# Patient Record
Sex: Female | Born: 1975
Health system: Southern US, Community
[De-identification: ages and names within clinical notes are randomized; demographics above are authoritative.]

## PROBLEM LIST (undated history)

## (undated) ENCOUNTER — Inpatient Hospital Stay (HOSPITAL_COMMUNITY): Payer: Self-pay

## (undated) DIAGNOSIS — Z87442 Personal history of urinary calculi: Secondary | ICD-10-CM

## (undated) DIAGNOSIS — F419 Anxiety disorder, unspecified: Secondary | ICD-10-CM

## (undated) HISTORY — PX: WISDOM TOOTH EXTRACTION: SHX21

## (undated) HISTORY — PX: DILATION AND CURETTAGE OF UTERUS: SHX78

## (undated) HISTORY — DX: Anxiety disorder, unspecified: F41.9

---

## 2012-10-06 NOTE — L&D Delivery Note (Signed)
SVD of VFI at 1104.  APGARS 9,9.  EBL 300cc.  Placenta to L&D. Head delivered ROA with loose nuchal x 1 reduced.  Body delivered atraumatically.  Mouth and nose bulb suctioned.  Cord clamped, cut and baby to abdomen.  Cord pH obtained.  Placenta delivered S/I/3VC.  Fundus firmed with pitocin and massage.  2nd degree perineal lac repaired with 3-0 Vicryl rapide in the normal fashion.  Mom and baby stable.

## 2013-03-28 LAB — OB RESULTS CONSOLE ABO/RH: RH Type: POSITIVE

## 2013-03-28 LAB — OB RESULTS CONSOLE HEPATITIS B SURFACE ANTIGEN: Hepatitis B Surface Ag: NEGATIVE

## 2013-03-28 LAB — OB RESULTS CONSOLE RUBELLA ANTIBODY, IGM: Rubella: IMMUNE

## 2013-03-28 LAB — OB RESULTS CONSOLE RPR: RPR: NONREACTIVE

## 2013-03-28 LAB — OB RESULTS CONSOLE HIV ANTIBODY (ROUTINE TESTING): HIV: NONREACTIVE

## 2013-09-09 ENCOUNTER — Encounter (HOSPITAL_COMMUNITY): Payer: Self-pay | Admitting: *Deleted

## 2013-09-09 ENCOUNTER — Inpatient Hospital Stay (HOSPITAL_COMMUNITY)
Admission: AD | Admit: 2013-09-09 | Discharge: 2013-09-09 | Disposition: A | Discharge status: Home / Self Care | Payer: BC Managed Care – PPO | Source: Ambulatory Visit | Attending: Obstetrics and Gynecology | Admitting: Obstetrics and Gynecology

## 2013-09-09 DIAGNOSIS — O26899 Other specified pregnancy related conditions, unspecified trimester: Secondary | ICD-10-CM

## 2013-09-09 DIAGNOSIS — O9989 Other specified diseases and conditions complicating pregnancy, childbirth and the puerperium: Secondary | ICD-10-CM

## 2013-09-09 DIAGNOSIS — N898 Other specified noninflammatory disorders of vagina: Secondary | ICD-10-CM | POA: Insufficient documentation

## 2013-09-09 DIAGNOSIS — N390 Urinary tract infection, site not specified: Secondary | ICD-10-CM | POA: Insufficient documentation

## 2013-09-09 DIAGNOSIS — R109 Unspecified abdominal pain: Secondary | ICD-10-CM

## 2013-09-09 DIAGNOSIS — O2390 Unspecified genitourinary tract infection in pregnancy, unspecified trimester: Secondary | ICD-10-CM | POA: Insufficient documentation

## 2013-09-09 LAB — URINE MICROSCOPIC-ADD ON

## 2013-09-09 LAB — URINALYSIS, ROUTINE W REFLEX MICROSCOPIC
Bilirubin Urine: NEGATIVE
Glucose, UA: NEGATIVE mg/dL
Protein, ur: NEGATIVE mg/dL
Urobilinogen, UA: 0.2 mg/dL (ref 0.0–1.0)

## 2013-09-09 MED ORDER — HYDROCODONE-ACETAMINOPHEN 5-325 MG PO TABS
1.0000 | ORAL_TABLET | Freq: Once | ORAL | Status: AC
  Administered 2013-09-09: 1 via ORAL
  Filled 2013-09-09: qty 1

## 2013-09-09 MED ORDER — NITROFURANTOIN MONOHYD MACRO 100 MG PO CAPS: 100.0000 mg | ORAL_CAPSULE | Freq: Two times a day (BID) | ORAL | Status: DC

## 2013-09-09 NOTE — MAU Provider Note (Signed)
History     CSN: [id number]  Arrival date and time: 09/09/13 1112   First Provider Initiated Contact with Patient 09/09/13 1158      Chief Complaint  Patient presents with  . Abdominal Pain   HPI Ms. Myria Steenbergen is a 37 y.o. G3P1011 at [redacted]w[redacted]d who presents to MAU today with complaint of right mid abdominal pain. The patient was seen by Dr. Rana Snare yesterday and told to follow-up here if pain continued. The patient states that the pain is sharp and feels like tightening. She denies N/V/D or constipation, fever, vaginal bleeding or LOF. She has had some vaginal discharge recently that is unchanged. She states that she noted occasional contractions earlier today, but much less now.   OB History   Grav Para Term Preterm Abortions TAB SAB Ect Mult Living   3 1 1  1  1   1       History reviewed. No pertinent past medical history.  History reviewed. No pertinent past surgical history.  History reviewed. No pertinent family history.  History  Substance Use Topics  . Smoking status: Never Smoker   . Smokeless tobacco: Never Used  . Alcohol Use: No    Allergies:  Allergies  Allergen Reactions  . Shellfish Allergy Anaphylaxis    seizures    Prescriptions prior to admission  Medication Sig Dispense Refill  . azithromycin (ZITHROMAX) 250 MG tablet Take 250 mg by mouth daily.      . butalbital-acetaminophen-caffeine (FIORICET, ESGIC) 50-325-40 MG per tablet Take 1 tablet by mouth 2 (two) times daily as needed for headache.      . Prenatal Vit-Fe Fumarate-FA (PRENATAL MULTIVITAMIN) TABS tablet Take 1 tablet by mouth daily at 12 noon.        Review of Systems  Constitutional: Negative for fever and malaise/fatigue.  Gastrointestinal: Positive for abdominal pain. Negative for nausea, vomiting, diarrhea and constipation.  Genitourinary: Negative for dysuria, urgency and frequency.       Neg - vaginal bleeding, LOF + vaginal discharge   Physical Exam   Blood pressure 114/75,  pulse 94, temperature 97.5 F (36.4 C), temperature source Oral, resp. rate 18, height 5\' 6"  (1.676 m), weight 174 lb 6.4 oz (79.107 kg), SpO2 99.00%.  Physical Exam  Constitutional: She is oriented to person, place, and time. She appears well-developed and well-nourished. No distress.  HENT:  Head: Normocephalic and atraumatic.  Cardiovascular: Normal rate.   Respiratory: Effort normal.  GI: Soft. She exhibits no distension and no mass. There is tenderness (mild tenderness to palpation of the right mid abdomen). There is no rebound and no guarding.  Neurological: She is alert and oriented to person, place, and time.  Skin: Skin is warm and dry. No erythema.  Psychiatric: She has a normal mood and affect.  Dilation: Closed Effacement (%): Thick Cervical Position: Posterior Exam by:: Elonda Husky RN  Results for orders placed during the hospital encounter of 09/09/13 (from the past 24 hour(s))  URINALYSIS, ROUTINE W REFLEX MICROSCOPIC     Status: Abnormal   Collection Time    09/09/13 12:14 PM      Result Value Range   Color, Urine YELLOW  YELLOW   APPearance CLOUDY (*) CLEAR   Specific Gravity, Urine >1.030 (*) 1.005 - 1.030   pH 6.0  5.0 - 8.0   Glucose, UA NEGATIVE  NEGATIVE mg/dL   Hgb urine dipstick LARGE (*) NEGATIVE   Bilirubin Urine NEGATIVE  NEGATIVE   Ketones, ur NEGATIVE  NEGATIVE mg/dL   Protein, ur NEGATIVE  NEGATIVE mg/dL   Urobilinogen, UA 0.2  0.0 - 1.0 mg/dL   Nitrite NEGATIVE  NEGATIVE   Leukocytes, UA SMALL (*) NEGATIVE  URINE MICROSCOPIC-ADD ON     Status: Abnormal   Collection Time    09/09/13 12:14 PM      Result Value Range   Squamous Epithelial / LPF MANY (*) RARE   WBC, UA 7-10  <3 WBC/hpf   RBC / HPF 0-2  <3 RBC/hpf   Bacteria, UA FEW (*) RARE   Urine-Other MUCOUS PRESENT     Fetal Monitoring: Basline 140 bpm, moderate variability, +accelerations, no decelerations Contractions: mild uterine irritibility MAU Course   Procedures None  MDM Discussed patient with Dr. Thana Ates. Check UA, cervix and give 1 Vicodin for pain Cervix is closed. UA results discussed with Dr. Thana Ates. Treat with Macrobid for presumptive UTI and send sample for culture. Have patient increase hydration and follow-up in the office next week  Assessment and Plan  A: Abdominal pain in pregnancy UTI  P: Discharge home Rx for macrobid sent to patient's pharmacy Patient advised to increase PO hydration as tolerated Patient advised to call the office on Monday for a follow-up appointment next week Preterm labor precautions discussed Patient may return to MAU as needed or if her condition were to change or worsen  Freddi Starr, PA-C  09/09/2013, 11:58 AM

## 2013-09-09 NOTE — MAU Note (Signed)
Patient presents to MAU with c/o right sided sharp intermittent abdominal pain since yesterday; states told OB about pain in the office appointment yesterday and was advised today to come in if pain did not subside. Denies LOF, VB,nor contractions at this time. Reports good fetal movement.

## 2013-09-11 LAB — URINE CULTURE

## 2013-09-12 ENCOUNTER — Encounter (HOSPITAL_COMMUNITY): Payer: Self-pay

## 2013-09-12 ENCOUNTER — Inpatient Hospital Stay (HOSPITAL_COMMUNITY): Payer: BC Managed Care – PPO

## 2013-09-12 ENCOUNTER — Inpatient Hospital Stay (HOSPITAL_COMMUNITY)
Admission: AD | Admit: 2013-09-12 | Discharge: 2013-09-18 | DRG: 781 | Disposition: A | Discharge status: Home / Self Care | Payer: BC Managed Care – PPO | Source: Ambulatory Visit | Attending: Obstetrics & Gynecology | Admitting: Obstetrics & Gynecology

## 2013-09-12 DIAGNOSIS — O26839 Pregnancy related renal disease, unspecified trimester: Principal | ICD-10-CM | POA: Diagnosis present

## 2013-09-12 DIAGNOSIS — N200 Calculus of kidney: Secondary | ICD-10-CM | POA: Diagnosis present

## 2013-09-12 DIAGNOSIS — R1031 Right lower quadrant pain: Secondary | ICD-10-CM | POA: Diagnosis present

## 2013-09-12 DIAGNOSIS — N1330 Unspecified hydronephrosis: Secondary | ICD-10-CM | POA: Diagnosis present

## 2013-09-12 DIAGNOSIS — R51 Headache: Secondary | ICD-10-CM | POA: Diagnosis present

## 2013-09-12 DIAGNOSIS — R52 Pain, unspecified: Secondary | ICD-10-CM

## 2013-09-12 DIAGNOSIS — O26899 Other specified pregnancy related conditions, unspecified trimester: Secondary | ICD-10-CM | POA: Diagnosis present

## 2013-09-12 DIAGNOSIS — R630 Anorexia: Secondary | ICD-10-CM | POA: Diagnosis present

## 2013-09-12 HISTORY — DX: Personal history of urinary calculi: Z87.442

## 2013-09-12 LAB — COMPREHENSIVE METABOLIC PANEL WITH GFR
ALT: 7 U/L (ref 0–35)
AST: 11 U/L (ref 0–37)
Albumin: 2.3 g/dL — ABNORMAL LOW (ref 3.5–5.2)
Alkaline Phosphatase: 145 U/L — ABNORMAL HIGH (ref 39–117)
BUN: 9 mg/dL (ref 6–23)
CO2: 23 meq/L (ref 19–32)
Calcium: 8.1 mg/dL — ABNORMAL LOW (ref 8.4–10.5)
Chloride: 103 meq/L (ref 96–112)
Creatinine, Ser: 0.53 mg/dL (ref 0.50–1.10)
GFR calc Af Amer: >90 mL/min
GFR calc non Af Amer: >90 mL/min
Glucose, Bld: 98 mg/dL (ref 70–99)
Potassium: 3.6 meq/L (ref 3.5–5.1)
Sodium: 135 meq/L (ref 135–145)
Total Bilirubin: <1 mg/dL (ref 0.3–1.2)
Total Protein: 6.2 g/dL (ref 6.0–8.3)

## 2013-09-12 LAB — CBC WITH DIFFERENTIAL/PLATELET
Basophils Relative: 0 % (ref 0–1)
Eosinophils Absolute: 0 10*3/uL (ref 0.0–0.7)
Eosinophils Relative: 0 % (ref 0–5)
HCT: 32 % — ABNORMAL LOW (ref 36.0–46.0)
Lymphocytes Relative: 14 % (ref 12–46)
Lymphs Abs: 1.3 10*3/uL (ref 0.7–4.0)
MCH: 31.8 pg (ref 26.0–34.0)
MCHC: 34.1 g/dL (ref 30.0–36.0)
MCV: 93.3 fL (ref 78.0–100.0)
Monocytes Absolute: 0.6 10*3/uL (ref 0.1–1.0)
Platelets: 284 10*3/uL (ref 150–400)
RBC: 3.43 MIL/uL — ABNORMAL LOW (ref 3.87–5.11)
RDW: 13 % (ref 11.5–15.5)
WBC: 8.9 10*3/uL (ref 4.0–10.5)

## 2013-09-12 LAB — URINALYSIS, ROUTINE W REFLEX MICROSCOPIC
Bilirubin Urine: NEGATIVE
Glucose, UA: NEGATIVE mg/dL
Ketones, ur: NEGATIVE mg/dL
Nitrite: NEGATIVE
Protein, ur: NEGATIVE mg/dL
Urobilinogen, UA: 0.2 mg/dL (ref 0.0–1.0)
pH: 6 (ref 5.0–8.0)

## 2013-09-12 LAB — URINE MICROSCOPIC-ADD ON

## 2013-09-12 LAB — AMYLASE: Amylase: 80 U/L (ref 0–105)

## 2013-09-12 MED ORDER — HYDROMORPHONE HCL PF 1 MG/ML IJ SOLN
INTRAMUSCULAR | Status: AC
  Administered 2013-09-12: 1 mg via INTRAVENOUS
  Filled 2013-09-12: qty 1

## 2013-09-12 MED ORDER — HYDROMORPHONE HCL PF 1 MG/ML IJ SOLN
1.0000 mg | Freq: Once | INTRAMUSCULAR | Status: AC
  Administered 2013-09-12: 1 mg via INTRAMUSCULAR

## 2013-09-12 MED ORDER — LACTATED RINGERS IV SOLN
INTRAVENOUS | Status: DC
  Administered 2013-09-12 – 2013-09-15 (×9): via INTRAVENOUS

## 2013-09-12 MED ORDER — PRENATAL MULTIVITAMIN CH
1.0000 | ORAL_TABLET | Freq: Every day | ORAL | Status: DC
  Administered 2013-09-13 – 2013-09-18 (×6): 1 via ORAL
  Filled 2013-09-12 (×6): qty 1

## 2013-09-12 MED ORDER — ZOLPIDEM TARTRATE 5 MG PO TABS
5.0000 mg | ORAL_TABLET | Freq: Every evening | ORAL | Status: DC | PRN
  Administered 2013-09-16 – 2013-09-17 (×3): 5 mg via ORAL
  Filled 2013-09-12 (×3): qty 1

## 2013-09-12 MED ORDER — HYDROCODONE-ACETAMINOPHEN 5-325 MG PO TABS
1.0000 | ORAL_TABLET | Freq: Once | ORAL | Status: AC
  Administered 2013-09-12: 1 via ORAL
  Filled 2013-09-12: qty 1

## 2013-09-12 MED ORDER — HYDROMORPHONE HCL PF 1 MG/ML IJ SOLN
1.0000 mg | INTRAMUSCULAR | Status: DC | PRN
  Administered 2013-09-12 – 2013-09-15 (×18): 1 mg via INTRAVENOUS
  Filled 2013-09-12 (×20): qty 1

## 2013-09-12 MED ORDER — DOCUSATE SODIUM 100 MG PO CAPS
100.0000 mg | ORAL_CAPSULE | Freq: Every day | ORAL | Status: DC
  Administered 2013-09-13: 100 mg via ORAL
  Filled 2013-09-12: qty 1

## 2013-09-12 MED ORDER — ACETAMINOPHEN 325 MG PO TABS
650.0000 mg | ORAL_TABLET | ORAL | Status: DC | PRN
  Administered 2013-09-14 – 2013-09-15 (×3): 650 mg via ORAL
  Filled 2013-09-12 (×3): qty 2

## 2013-09-12 MED ORDER — CALCIUM CARBONATE ANTACID 500 MG PO CHEW
2.0000 | CHEWABLE_TABLET | ORAL | Status: DC | PRN
  Administered 2013-09-14: 400 mg via ORAL
  Filled 2013-09-12: qty 1

## 2013-09-12 NOTE — MAU Note (Signed)
Pt states here for constant right sided pain that began Thursday. Pain was the worst it has been yesterday. Has had a lot of vaginal discharge, thinks it may be yeast. Has to change underwear constantly. States does not feel like she did before when she had a kidney stone.

## 2013-09-12 NOTE — MAU Note (Signed)
Patient states she has been having right lower abdominal pain for a while, states the pain is constant but has periods of intense pain. Has been having a headache for a while, thought she had a sinus infection and is on day 5 of antibiotics and is not helping. Denies fever or nausea or vomiting. Has a history of kidney stones about 15 years ago. Having some irregular contractions. Denies bleeding or leaking and reports good fetal movement.

## 2013-09-12 NOTE — H&P (Signed)
Casey [name] is a 37 y.o. female presenting to the office today with complaint of continue right-sided abdominal pain and headache.  She presented to the office with the same complaint on Thursday and was given a z-pak and instructions to go to MAU if continued pain.  She presented to MAU Friday with pain and was treated empirically for UTI with Macrobid and was given Vicodin for pain control which did not help her headache or abdominal pain.  She reports her pain has continued and is constant with random exacerbation which feel like a twisting pain in the RLQ.  She has no history of abdominal surgeries.  She has a remote h/o nephrolithiasis but she reports this does not feel the same. She does have moderate blood on UA with recent C&S contaminated. Her white count is normal. She denies fever, chills, nausea and vomiting.  She reports decreased appetite.  She has had no vaginal bleeding except after vaginal exams in MAU (closed cervix).  She is frustrated because she is unable to work and take care of her child at home secondary to the severity of pain.  She does have a h/o migraines but this does not feel the same.  It has not responded to Fioricet.  She denies CP/SOB, RUQ pain, and vision change.  She has always been normotensive.       Maternal Medical History:  Reason for admission: Nausea.  Fetal activity: Perceived fetal activity is normal.   Last perceived fetal movement was within the past hour.    Prenatal complications: no prenatal complications Prenatal Complications - Diabetes: none.    OB History   Grav Para Term Preterm Abortions TAB SAB Ect Mult Living   3 1 1  1  1   1      Past Medical History  Diagnosis Date  . History of kidney stones    Past Surgical History  Procedure Laterality Date  . Wisdom tooth extraction     Family History: family history is not on file. Social History:  reports that she has never smoked. She has never used smokeless tobacco. She reports that  she does not drink alcohol or use illicit drugs.   Prenatal Transfer Tool  Maternal Diabetes: No Genetic Screening: Declined Maternal Ultrasounds/Referrals: Normal Fetal Ultrasounds or other Referrals:  None Maternal Substance Abuse:  No Significant Maternal Medications:  None Significant Maternal Lab Results:  None Other Comments:  None  Renal U/S-mild right hydronephrosis OB U/S-unremarkable CT A/P-moderate right hydronephrosis attributed to pregnancy; bilateral stones seen although non-obstructive (largest measures 4mm)  Review of Systems  Constitutional: Negative for fever and chills.  Eyes: Negative for blurred vision.  Cardiovascular: Negative for chest pain.  Gastrointestinal: Positive for abdominal pain. Negative for nausea and vomiting.  Genitourinary: Negative for dysuria and urgency.  Skin: Negative for rash.  Neurological: Positive for headaches.  No flank pain    Blood pressure 117/71, pulse 85, temperature 98 F (36.7 C), temperature source Oral, resp. rate 18, height 5\' 6"  (1.676 m), weight 174 lb (78.926 kg), SpO2 97.00%. Maternal Exam:  Abdomen: Patient reports the following abdominal tenderness: suprapubic and RLQ.  Fundal height is c/w dates.   Estimated fetal weight is 4,6.       Physical Exam  Constitutional: She is oriented to person, place, and time. She appears well-developed and well-nourished.  GI: Soft. There is tenderness in the right lower quadrant and suprapubic area. There is no rebound and no guarding.  Neurological: She is alert  and oriented to person, place, and time.  Skin: Skin is warm and dry.  Psychiatric: She has a normal mood and affect. Her behavior is normal.    Prenatal labs: ABO, Rh:   Antibody:   Rubella:   RPR:    HBsAg:    HIV:    GBS:     Assessment/Plan: 37yo G3P1011 at [redacted]w[redacted]d with abdominal pain; suspect nephrolithiasis -Admit for pain control -IVF -Urine C&S pending  Casey [name] 09/12/2013, 8:45  PM

## 2013-09-12 NOTE — MAU Provider Note (Signed)
History     CSN: [id number]  Arrival date and time: 09/12/13 1225   First Provider Initiated Contact with Patient 09/12/13 1330      Chief Complaint  Patient presents with  . Abdominal Pain   HPI Ms. Casey Casey [name] is a 37 y.o. G3P1011 at [redacted]w[redacted]d who presents to MAU today with right sided abdominal pain. The patient was seen here on Friday with the same complaint. She was sent home to rest and increase hydration over the weekend. She states that she did that, but felt that pain became worse yesterday. She continues to have constant pain with worsening episodes of sharp pains that come and go. She has also had severe headaches since the abdominal pain started. The patient denies fever, vaginal bleeding, LOF, N/V/D or constipation. She has continued to have Casey [name] increased amount of discharge that was checked in the office today. The patient reports good fetal movement.   OB History   Grav Para Term Preterm Abortions TAB SAB Ect Mult Living   3 1 1  1  1   1       Past Medical History  Diagnosis Date  . History of kidney stones     Past Surgical History  Procedure Laterality Date  . Wisdom tooth extraction      History reviewed. No pertinent family history.  History  Substance Use Topics  . Smoking status: Never Smoker   . Smokeless tobacco: Never Used  . Alcohol Use: No    Allergies:  Allergies  Allergen Reactions  . Shellfish Allergy Anaphylaxis    seizures    Prescriptions prior to admission  Medication Sig Dispense Refill  . azithromycin (ZITHROMAX) 250 MG tablet Take 250 mg by mouth daily.      . butalbital-acetaminophen-caffeine (FIORICET, ESGIC) 50-325-40 MG per tablet Take 1 tablet by mouth 2 (two) times daily as needed for headache.      . Prenatal Vit-Fe Fumarate-FA (PRENATAL MULTIVITAMIN) TABS tablet Take 1 tablet by mouth daily at 12 noon.      . pseudoephedrine (SUDAFED) 30 MG tablet Take 60 mg by mouth every 4 (four) hours as needed for congestion.         Review of Systems  Constitutional: Negative for fever and malaise/fatigue.  Gastrointestinal: Positive for abdominal pain. Negative for nausea, vomiting, diarrhea and constipation.  Genitourinary: Negative for dysuria, urgency, frequency and hematuria.       Neg - vaginal bleeding, LOF + vaginal discharge   Physical Exam   Blood pressure 111/65, pulse 95, resp. rate 16, SpO2 98.00%.  Physical Exam  Constitutional: She is oriented to person, place, and time. She appears well-developed and well-nourished. No distress.  HENT:  Head: Normocephalic and atraumatic.  Cardiovascular: Normal rate, regular rhythm and normal heart sounds.   Respiratory: Effort normal and breath sounds normal. No respiratory distress.  GI: Soft. Bowel sounds are normal. She exhibits no distension and no mass. There is tenderness (moderate tenderness to palpation of the right mid abdomen). There is guarding. There is no rebound and no CVA tenderness.  Neurological: She is alert and oriented to person, place, and time.  Skin: Skin is warm and dry. No erythema.  Psychiatric: She has a normal mood and affect.   Results for orders placed during the hospital encounter of 09/12/13 (from the past 24 hour(s))  URINALYSIS, ROUTINE W REFLEX MICROSCOPIC     Status: Abnormal   Collection Time    09/12/13 12:50 PM  Result Value Range   Color, Urine YELLOW  YELLOW   APPearance TURBID (*) CLEAR   Specific Gravity, Urine >1.030 (*) 1.005 - 1.030   pH 6.0  5.0 - 8.0   Glucose, UA NEGATIVE  NEGATIVE mg/dL   Hgb urine dipstick MODERATE (*) NEGATIVE   Bilirubin Urine NEGATIVE  NEGATIVE   Ketones, ur NEGATIVE  NEGATIVE mg/dL   Protein, ur NEGATIVE  NEGATIVE mg/dL   Urobilinogen, UA 0.2  0.0 - 1.0 mg/dL   Nitrite NEGATIVE  NEGATIVE   Leukocytes, UA MODERATE (*) NEGATIVE  URINE MICROSCOPIC-ADD ON     Status: Abnormal   Collection Time    09/12/13 12:50 PM      Result Value Range   Squamous Epithelial / LPF FEW (*)  RARE   WBC, UA 3-6  <3 WBC/hpf   RBC / HPF 3-6  <3 RBC/hpf   Bacteria, UA FEW (*) RARE   Urine-Other AMORPHOUS URATES/PHOSPHATES    CBC WITH DIFFERENTIAL     Status: Abnormal   Collection Time    09/12/13  1:41 PM      Result Value Range   WBC 8.9  4.0 - 10.5 K/uL   RBC 3.43 (*) 3.87 - 5.11 MIL/uL   Hemoglobin 10.9 (*) 12.0 - 15.0 g/dL   HCT 16.1 (*) 09.6 - 04.5 %   MCV 93.3  78.0 - 100.0 fL   MCH 31.8  26.0 - 34.0 pg   MCHC 34.1  30.0 - 36.0 g/dL   RDW 40.9  81.1 - 91.4 %   Platelets 284  150 - 400 K/uL   Neutrophils Relative % 79 (*) 43 - 77 %   Neutro Abs 7.1  1.7 - 7.7 K/uL   Lymphocytes Relative 14  12 - 46 %   Lymphs Abs 1.3  0.7 - 4.0 K/uL   Monocytes Relative 6  3 - 12 %   Monocytes Absolute 0.6  0.1 - 1.0 K/uL   Eosinophils Relative 0  0 - 5 %   Eosinophils Absolute 0.0  0.0 - 0.7 K/uL   Basophils Relative 0  0 - 1 %   Basophils Absolute 0.0  0.0 - 0.1 K/uL   Ct Abdomen Pelvis Wo Contrast  09/12/2013   CLINICAL DATA:  Patient is [redacted] weeks pregnant and complaining of right sided pain for 4 days, concern for appendicitis versus kidney stone  EXAM: CT ABDOMEN AND PELVIS WITHOUT CONTRAST  TECHNIQUE: Multidetector CT imaging of the abdomen and pelvis was performed following the standard protocol without intravenous contrast.  COMPARISON:  Ultrasound performed earlier today  FINDINGS: Mild dependent atelectasis and minimal pleural effusions. There is a pericardial effusion measuring up to 11 mm, predominantly anteriorly.  In the absence of contrast, the liver, gallbladder, spleen, pancreas, and adrenal glands appear normal.  There is Casey [name] exophytic 1 cm upper pole left renal cyst with average attenuation value of 6. There is a 2 mm stone in the upper pole the left kidney and a 2 mm stone in the midpole of the left kidney. There is no hydronephrosis on the left. On the right side, there is a 4 mm stone in the midpole. There is moderate dilatation of the renal pelvis and right ureter.  There are no ureteral or bladder stones.  There is a gravid uterus. Bowel is normal. Appendix shows no dilatation or inflammation. There are no acute musculoskeletal abnormalities.  IMPRESSION: 1. No evidence of appendicitis. 2. Moderate dilatation of the right renal pelvis and  right ureter. This is likely due to the gravid state of the uterus. Although there are bilateral renal calculi, there are no ureteral stones on the right. The intrarenal small calculi do not appear to be related to obstruction. There is no perinephric inflammatory change on the right.   Electronically Signed   By: Esperanza Heir M.D.   On: 09/12/2013 18:17   US Renal  09/12/2013   CLINICAL DATA:  Right-sided pain.  History of kidney stones.  EXAM: RENAL/URINARY TRACT ULTRASOUND COMPLETE  COMPARISON:  None.  FINDINGS: Right Kidney:  Length: 11.9 cm. Echogenicity within normal limits. There is mild right hydronephrosis. No mass visualized.  Left Kidney:  Length: 12.1. Echogenicity within normal limits. No mass or hydronephrosis visualized.  Bladder:  Appears normal for degree of bladder distention.  Incidental note is made 2 well circumscribed hyperechoic masses in the right lobe of the liver measuring 4.0 x 2.4 x 2.4 cm and 3.5 x 2.6 x 2.6 cm.  IMPRESSION: 1. Mild right hydronephrosis. 2. Two hyperechoic liver masses, most likely benign and reflecting incidental hemangiomas unless the patient is at increased risk of primary liver malignancy or has known malignancy elsewhere.   Electronically Signed   By: Sebastian Ache   On: 09/12/2013 15:07    MAU Course  Procedures None  MDM Discussed patient presentation, lab results and renal US results with Dr. Langston Masker. Order OB limited US today Discussed OB US with Dr. Langston Masker. Results of all of the above labs do not account for the patient's level of pain Vicodin given in MAU with minimal relief of pain 1 mg IM Dilaudid given in MAU without significant reduction in pain Order CT of the  abdomen and pelvis without contrast Discussed results with Dr. Langston Masker. She has reviewed the results herself in Epic.  Dr. Langston Masker recommends patient can choose between inpatient pain management or outpatient pain management with follow-up tomorrow afternoon Discussed options with the patient. She feels that she needs to stay for inpatient pain management Assessment and Plan  A: Right sided abdominal pain Presumptive right kidney stone  P: Admit for observation to [facility] for pain management  Freddi Starr, PA-C  09/12/2013, 7:32 PM

## 2013-09-12 NOTE — MAU Note (Signed)
Pt to CT via WC

## 2013-09-13 ENCOUNTER — Ambulatory Visit (HOSPITAL_COMMUNITY): Payer: BC Managed Care – PPO

## 2013-09-13 ENCOUNTER — Inpatient Hospital Stay (HOSPITAL_COMMUNITY): Payer: BC Managed Care – PPO

## 2013-09-13 LAB — CBC
Hemoglobin: 11.5 g/dL — ABNORMAL LOW (ref 12.0–15.0)
MCH: 32.4 pg (ref 26.0–34.0)
MCHC: 34 g/dL (ref 30.0–36.0)
Platelets: 279 10*3/uL (ref 150–400)
RDW: 13.1 % (ref 11.5–15.5)

## 2013-09-13 LAB — COMPREHENSIVE METABOLIC PANEL
ALT: 6 U/L (ref 0–35)
Albumin: 2.3 g/dL — ABNORMAL LOW (ref 3.5–5.2)
Alkaline Phosphatase: 145 U/L — ABNORMAL HIGH (ref 39–117)
Calcium: 8.5 mg/dL (ref 8.4–10.5)
GFR calc Af Amer: >90 mL/min (ref >90)
Glucose, Bld: 75 mg/dL (ref 70–99)
Sodium: 137 mEq/L (ref 135–145)
Total Protein: 6.8 g/dL (ref 6.0–8.3)

## 2013-09-13 LAB — URINE CULTURE
Colony Count: NO GROWTH
Culture: NO GROWTH

## 2013-09-13 LAB — DIC (DISSEMINATED INTRAVASCULAR COAGULATION)PANEL
Fibrinogen: 796 mg/dL — ABNORMAL HIGH (ref 204–475)
INR: 0.95 (ref 0.00–1.49)
Smear Review: NONE SEEN
aPTT: 31 seconds (ref 24–37)

## 2013-09-13 MED ORDER — ONDANSETRON HCL 4 MG PO TABS
8.0000 mg | ORAL_TABLET | Freq: Four times a day (QID) | ORAL | Status: DC | PRN
  Administered 2013-09-14: 8 mg via ORAL
  Filled 2013-09-13 (×3): qty 2

## 2013-09-13 MED ORDER — DOCUSATE SODIUM 100 MG PO CAPS
100.0000 mg | ORAL_CAPSULE | Freq: Two times a day (BID) | ORAL | Status: DC
  Administered 2013-09-13 – 2013-09-18 (×10): 100 mg via ORAL
  Filled 2013-09-13 (×10): qty 1

## 2013-09-13 MED ORDER — POLYETHYLENE GLYCOL 3350 17 G PO PACK
17.0000 g | PACK | Freq: Every day | ORAL | Status: DC
  Administered 2013-09-13 – 2013-09-14 (×2): 17 g via ORAL
  Filled 2013-09-13 (×7): qty 1

## 2013-09-13 MED ORDER — LACTATED RINGERS IV BOLUS (SEPSIS)
500.0000 mL | Freq: Once | INTRAVENOUS | Status: AC
  Administered 2013-09-13: 500 mL via INTRAVENOUS

## 2013-09-13 MED ORDER — LIDOCAINE 5 % EX PTCH
1.0000 | MEDICATED_PATCH | CUTANEOUS | Status: AC
  Administered 2013-09-13: 1 via TRANSDERMAL
  Filled 2013-09-13: qty 1

## 2013-09-13 NOTE — Progress Notes (Signed)
MATERNAL FETAL MEDICINE CONSULT  Patient Name: Casey [name] Medical Record Number:  [id number] Date of Birth: 1976/03/29 Requesting Physician Name:  Mitchel Honour, DO Date of Service: 09/13/2013  Chief Complaint Right sided abdominal pain.    History of Present Illness Casey [name] was seen today secondary to right sided abdominal pain at the request of Mitchel Honour, DO.  The patient is a 37 y.o. G3P1011,at [redacted]w[redacted]d with an EDD of 10/24/2013.  She began experiencing abdominal pain late last week and was seen at her primary OB's office at that time.  Her pain transiently improved at first, but then significantly worsened over the weekend prompting admission yesterday.  She has had no vaginal bleeding, loss of fluid, or clear contractions.  She reports normal fetal movement now and throughout her episode of pain.  Review of Systems Pertinent items are noted in HPI.  Patient History OB History  Gravida Para Term Preterm AB SAB TAB Ectopic Multiple Living  3 1 1  1 1    1     # Outcome Date GA Lbr Len/2nd Weight Sex Delivery Anes PTL Lv  3 CUR           2 SAB           1 TRM      SVD   Y      Past Medical History  Diagnosis Date  . History of kidney stones     Past Surgical History  Procedure Laterality Date  . Wisdom tooth extraction      History   Social History  . Marital Status: Married    Spouse Name: N/A    Number of Children: N/A  . Years of Education: N/A   Social History Main Topics  . Smoking status: Never Smoker   . Smokeless tobacco: Never Used  . Alcohol Use: No  . Drug Use: No  . Sexual Activity: Yes    Birth Control/ Protection: None   Other Topics Concern  . None   Social History Narrative  . None    History reviewed. No pertinent family history. In addition, the patient has no family history of mental retardation, birth defects, or genetic diseases.  Physical Examination Filed Vitals:   09/13/13 1136  BP: 114/58  Pulse: 85  Temp:  98.2 F (36.8 C)  Resp: 20   General appearance - alert, well appearing, and in no distress Abdomen - tenderness noted in the RUQ and RLQ, no rebound, no guarding Extremities - no pedal edema noted  Assessment and Recommendations 1.  Right sided abdominal pain.  The differential diagnosis includes placental abruption, abdominal wall pain, or uterine irritation.  At this time the concern for placental abruption is low as there is no evidence of abruption on ultrasound and that the pain has persisted for several days without the expected deterioration of fetal status with abruption.  Her BPP was 8/10 with -2 for lack of fetal breathing.  Thus, it is more likely musculoskeletal pain that can be treated symptomatically with oral pain medication as well as local symptomatic relief with a lidoderm patch or heating pad.  However, placental abruption cannot be ruled out on ultrasound so it remains a possibility.  I recommend continued close monitoring of both maternal and fetal status as an inpatient.  At this late preterm gestational age, I would have a low threshold for delivery if the fetal status becomes non-reassuring.  However, if the fetal status remains reassuring and [name] does not  experience a worsening of her symptoms expectant management is reasonable.  Recommendations discussed with Dr. Henderson Cloud.  I spent 30 minutes with [name] today of which 50% was face-to-face counseling.  Thank you for referring [name] to the [facility]  Please do not hesitate to contact us with questions.   Rema Fendt, MD

## 2013-09-13 NOTE — Progress Notes (Signed)
Pain continues-RLQ on right side of uterus, hurts with fetal movement, better if lying on left side-worse if on right side, constant but waxes and wanes, sometimes a twisting sensation. No trauma, no bleeding/ROM. Had kidney stones about 15 years ago. This pain is different. There is no colicky component. No gross hematuria, no pain with urination, no N/V. Last BM 2 days ago. Also onset of HA with this behind both eyes-sometimes severe. No scotomata. Sometimes pain brings her to her knees. Partial relief with narcotic.  VSS Afeb Patient winces with fetal movement. Tender over right side of uterus, left side NT Uterus soft No CVAT  DTR 2+ without clonus  FHT reacitve UCs mild irritability  CT bilat small kidney stones in renal pelvis, moderate right hydronephrosisi Renal U/S-same, no description of ureteral jets OB U/S no evidence of abruption  A: RLQ pain-possible right adnexal/uterine pain                     -possible occult abruption  P: Repeat labs     Repeat U/S with BPP and MFM consult

## 2013-09-13 NOTE — Progress Notes (Signed)
Pain about same, still requires narcotic for rest No relief with Lidoderm patch Normal apatite    VSS Afeb Uterus soft, tender RLQ down to groin  FHT reactive UCs - was contracting some earlier in day, was given IV fluid bolus, now none  Labs OK  U/S BPP 8/10 (no sustained breathing), no obvious abruption MFM consult noted  A: RLQ pain  P: D/W patient     Will continue IV fluids, continuous FM, observation

## 2013-09-14 ENCOUNTER — Inpatient Hospital Stay (HOSPITAL_COMMUNITY): Payer: BC Managed Care – PPO

## 2013-09-14 ENCOUNTER — Encounter (HOSPITAL_COMMUNITY): Payer: BC Managed Care – PPO

## 2013-09-14 MED ORDER — HYDROCORTISONE 1 % EX CREA
TOPICAL_CREAM | Freq: Four times a day (QID) | CUTANEOUS | Status: DC
  Administered 2013-09-14 – 2013-09-16 (×5): via TOPICAL
  Filled 2013-09-14: qty 28

## 2013-09-14 NOTE — Progress Notes (Signed)
.  [redacted]w[redacted]d  S// still c/o RLQ ant abd wall pain, req PO Dilaudid  O// BP 106/84  Pulse 81  Temp(Src) 98.2 F (36.8 C) (Oral)  Resp 20  Ht 5\' 6"  (1.676 m)  Wt 78.926 kg (174 lb)  BMI 28.10 kg/m2  SpO2 98%  Fundus nontender, FHR 148  A+P// [redacted]w[redacted]d w/ unexplained rlq ant abd wall pain, neg CT, neg [place], will cont to obsv

## 2013-09-15 MED ORDER — LACTATED RINGERS IV SOLN
Freq: Once | INTRAVENOUS | Status: AC
  Administered 2013-09-15: 14:00:00 via INTRAVENOUS

## 2013-09-15 MED ORDER — OXYCODONE-ACETAMINOPHEN 5-325 MG PO TABS
1.0000 | ORAL_TABLET | ORAL | Status: DC | PRN
  Administered 2013-09-15 (×3): 2 via ORAL
  Administered 2013-09-16 (×2): 1 via ORAL
  Administered 2013-09-16 – 2013-09-18 (×3): 2 via ORAL
  Filled 2013-09-15 (×7): qty 2

## 2013-09-15 MED ORDER — CLOTRIMAZOLE 1 % VA CREA
1.0000 | TOPICAL_CREAM | Freq: Every day | VAGINAL | Status: DC
  Filled 2013-09-15: qty 45

## 2013-09-15 MED ORDER — CLOTRIMAZOLE 2 % VA CREA: 1.0000 | TOPICAL_CREAM | Freq: Every day | VAGINAL | Status: DC

## 2013-09-15 MED ORDER — DIPHENHYDRAMINE HCL 50 MG/ML IJ SOLN
25.0000 mg | Freq: Once | INTRAMUSCULAR | Status: AC
  Administered 2013-09-15: 25 mg via INTRAVENOUS
  Filled 2013-09-15: qty 1

## 2013-09-15 MED ORDER — DEXAMETHASONE SODIUM PHOSPHATE 10 MG/ML IJ SOLN
10.0000 mg | Freq: Once | INTRAMUSCULAR | Status: AC
  Administered 2013-09-15: 10 mg via INTRAVENOUS
  Filled 2013-09-15: qty 1

## 2013-09-15 MED ORDER — HYDROMORPHONE HCL 2 MG PO TABS
1.0000 mg | ORAL_TABLET | ORAL | Status: DC | PRN
  Administered 2013-09-15: 1 mg via ORAL
  Filled 2013-09-15: qty 1

## 2013-09-15 MED ORDER — METOCLOPRAMIDE HCL 5 MG/ML IJ SOLN
10.0000 mg | Freq: Once | INTRAMUSCULAR | Status: AC
  Administered 2013-09-15: 10 mg via INTRAVENOUS
  Filled 2013-09-15: qty 2

## 2013-09-15 MED ORDER — CYCLOBENZAPRINE HCL 10 MG PO TABS
10.0000 mg | ORAL_TABLET | Freq: Once | ORAL | Status: AC
Start: 2013-09-15 — End: 2013-09-15
  Administered 2013-09-15: 10 mg via ORAL
  Filled 2013-09-15 (×2): qty 1

## 2013-09-15 NOTE — Progress Notes (Signed)
Pt still c/o RLQ pain.  Given PO meds today with similar relief to IV meds.  C/o poor appetite.  No vomiting.  Also, c/o headaches today.  + FM  AF, VSS + FHT Gen - NAD Abd - tender RLQ, fundus NT Ext - NT, no edema  A/P:  Unexplained RLQ pain Will transition to PO meds today and increase activity in hospital If stable and pain managed, consider d/c tomorrow.

## 2013-09-15 NOTE — Progress Notes (Signed)
Pt encouraged to ambulate during shift assessment.  Stated that she would wait for spouse to ambulate and shower.  Pt now complaining of too much pain and requesting pain meds.

## 2013-09-16 ENCOUNTER — Inpatient Hospital Stay (HOSPITAL_COMMUNITY): Payer: BC Managed Care – PPO

## 2013-09-16 LAB — COMPREHENSIVE METABOLIC PANEL
AST: 15 U/L (ref 0–37)
Alkaline Phosphatase: 127 U/L — ABNORMAL HIGH (ref 39–117)
BUN: 5 mg/dL — ABNORMAL LOW (ref 6–23)
CO2: 25 mEq/L (ref 19–32)
Chloride: 103 mEq/L (ref 96–112)
Creatinine, Ser: 0.62 mg/dL (ref 0.50–1.10)
GFR calc Af Amer: >90 mL/min (ref >90)
GFR calc non Af Amer: >90 mL/min (ref >90)
Potassium: 3 mEq/L — ABNORMAL LOW (ref 3.5–5.1)
Total Bilirubin: 0.2 mg/dL — ABNORMAL LOW (ref 0.3–1.2)

## 2013-09-16 LAB — CBC
HCT: 33.1 % — ABNORMAL LOW (ref 36.0–46.0)
MCV: 93.8 fL (ref 78.0–100.0)
Platelets: 255 10*3/uL (ref 150–400)
RBC: 3.53 MIL/uL — ABNORMAL LOW (ref 3.87–5.11)
WBC: 8.8 10*3/uL (ref 4.0–10.5)

## 2013-09-16 LAB — URIC ACID: Uric Acid, Serum: 4 mg/dL (ref 2.4–7.0)

## 2013-09-16 MED ORDER — CLOTRIMAZOLE 2 % VA CREA
1.0000 | TOPICAL_CREAM | Freq: Every day | VAGINAL | Status: DC
  Administered 2013-09-16 – 2013-09-17 (×2): 1 via VAGINAL
  Filled 2013-09-16: qty 22.2

## 2013-09-16 MED ORDER — CLOTRIMAZOLE 1 % VA CREA
1.0000 | TOPICAL_CREAM | Freq: Every day | VAGINAL | Status: DC
  Filled 2013-09-16: qty 45

## 2013-09-16 MED ORDER — HYDROMORPHONE HCL 2 MG PO TABS
2.0000 mg | ORAL_TABLET | ORAL | Status: DC | PRN
  Administered 2013-09-16 – 2013-09-17 (×8): 2 mg via ORAL
  Filled 2013-09-16 (×8): qty 1

## 2013-09-16 MED ORDER — SODIUM CHLORIDE 0.9 % IJ SOLN
3.0000 mL | Freq: Two times a day (BID) | INTRAMUSCULAR | Status: DC
  Administered 2013-09-16 – 2013-09-17 (×2): 3 mL via INTRAVENOUS

## 2013-09-16 NOTE — Progress Notes (Signed)
Patient transferred to room 154

## 2013-09-16 NOTE — Progress Notes (Signed)
Pt walked out of room to nursing station and asked if it was time for something for pain.  Rn mistakenly said that it was not time.  Pain med and muscle relaxer given at 2200.Marland Kitchen Pt then said that she is having trouble sleeping and that she needed something to help her sleep through spouse snoring.  Ambien offered and pt receptive.

## 2013-09-16 NOTE — Progress Notes (Signed)
Patient ID: Casey [name], female   DOB: [birth year], 37 y.o.   MRN: [id number] Pt tried to switch to oral pain meds yesterday without good success.  She cont in moderate pain and was hurting by the time next dose was due. She desperately wants to go home, but realizes she could not manage the pain and her child/family like this. GFM  VSSAF FHR 140s Cat 1 Ctxs none  Abd Gravid, tender or right side of uterus 3/5 No rebound, guarding  BS+ No flank pain  Right sided pain - mod - severe Unknown etiology Discussed options at length with [name] is usually very active and doesn't let things slow her down so this is very frustrating and unusual for her I rec we increase the pain meds to achieve improvement of pain Goal 3-4/10 versus current 7-8 Repeat BPP and eval of placenta.  Repeat US of kidneys Check labs Continue hospitization until pain under control.  If remains severe, and or requires large amounts of narcotics to achieve control with no obvious cause, consider delivery so that we can further eval cause of pain or treat surgically if necessary. DL

## 2013-09-16 NOTE — Progress Notes (Signed)
Patient resting.  Her family will be in later.

## 2013-09-17 NOTE — Progress Notes (Signed)
Pt calling order for breakfast

## 2013-09-17 NOTE — Progress Notes (Signed)
Pt requesting her SL to be removed.  Md informed and orders received to remove

## 2013-09-17 NOTE — Progress Notes (Signed)
Pt ambulating the hallways with family members

## 2013-09-17 NOTE — Progress Notes (Signed)
Patient ID: Casey [name], female   DOB: [birth year], 37 y.o.   MRN: [id number] Pt still with Right sided pain VSSAF FHR cat 1 Ctx occas  [place] BPP 8/8 and no change Renal US - Mild right hydronephrosis c/w gravid uterus (no change) Labs stable and normal  No flank pain Right Abd wall/Uterus  2/5 tender to palpation  IUP at 34 5/7 Pain - cont pain management.  Unknown etiology (? Abdominal wall verus uterus) DL

## 2013-09-18 MED ORDER — ZOLPIDEM TARTRATE 10 MG PO TABS: 10.0000 mg | ORAL_TABLET | Freq: Every evening | ORAL | Status: DC | PRN

## 2013-09-18 MED ORDER — OXYCODONE-ACETAMINOPHEN 5-325 MG PO TABS: 1.0000 | ORAL_TABLET | ORAL | Status: DC | PRN

## 2013-09-18 MED ORDER — HYDROMORPHONE HCL 2 MG PO TABS: 2.0000 mg | ORAL_TABLET | ORAL | Status: DC | PRN

## 2013-09-18 NOTE — Progress Notes (Addendum)
Pt given discharge instructions and reviewed pain medication prescriptions.  Pt instructed on the risks of too much acetaminophen when taking Percocet.  Pt verlbalizes understanding of NO MORE than 4000mg  in a 24 hour period.  Reviewed with pt how to figure the amt and how much was in one pill using the teach back method. Pt also instructed to Log the times that she was taking the medication so that she could review before each dose before taking it.. Reviewed with pt fetal kick counts and pt verlbalizes understanding

## 2013-09-18 NOTE — Discharge Summary (Signed)
Physician Discharge Summary  Patient ID: Casey [name] MRN: [id number] DOB/AGE: 07-02-1976 37 y.o.  Admit date: 09/12/2013 Discharge date: 09/18/2013  Admission Diagnoses:  Discharge Diagnoses:  Active Problems:   Abdominal pain in pregnancy, antepartum   Discharged Condition: stable  Hospital Course: Pt admitted with Right sided pain in pregnancy.  CT/US showed stones that are not obstructive and hydronephrosis on right due to gravid uterus.  Her pain is more consistent with right side of uterus or abdominal wall.   Multiple US showed no evidence of abruption or fetal compromise, and fetal monitoring for 1 week now all reassuring.  MFM states the management should be pain management for unknown right sided pain.  She has been able to control the pain with oral meds in the last 24 hours and wants to be managed as outpt.  Consults: MFM  Significant Diagnostic Studies: Ultrasounds, CT, labs  Treatments: IV hydration and analgesia: Dilaudid  Discharge Exam: Blood pressure 123/71, pulse 87, temperature 97.9 F (36.6 C), temperature source Oral, resp. rate 18, height 5\' 6"  (1.676 m), weight 80.74 kg (178 lb), SpO2 96.00%. General appearance: alert, cooperative, appears stated age and mild distress GI: soft, non-tender; bowel sounds normal; no masses,  no organomegaly and Still mild tenderness to palpation on right abdominal wall 1-2/5.  no rebound or guarding, no flank pain  Disposition: 01-Home or Self Care  Discharge Orders   Future Orders Complete By Expires   Call MD for:  difficulty breathing, headache or visual disturbances  As directed    Call MD for:  persistant nausea and vomiting  As directed    Call MD for:  redness, tenderness, or signs of infection (pain, swelling, redness, odor or green/yellow discharge around incision site)  As directed    Call MD for:  severe uncontrolled pain  As directed    Call MD for:  temperature >100.4  As directed    Diet general  As  directed    Discharge instructions  As directed    Comments:     Continue with modified bedrest as needed for pain relief Kickcounts Followup in office as scheduled in 3 days with fetal monitoring Call or return for increase pain, contractions, or decrease fetal movement   Driving Restrictions  As directed    Comments:     No driving for 2 weeks   Increase activity slowly  As directed    Lifting restrictions  As directed    Comments:     No lifting anything greater than 10 pounds (if you have to ask, don't lift it)   Sexual Activity Restrictions  As directed    Comments:     Nothing in the vagina for 6 weeks       Medication List         azithromycin 250 MG tablet  Commonly known as:  ZITHROMAX  Take 250 mg by mouth daily.     butalbital-acetaminophen-caffeine 50-325-40 MG per tablet  Commonly known as:  FIORICET, ESGIC  Take 1 tablet by mouth 2 (two) times daily as needed for headache.     HYDROmorphone 2 MG tablet  Commonly known as:  DILAUDID  Take 1 tablet (2 mg total) by mouth every 3 (three) hours as needed for severe pain.     oxyCODONE-acetaminophen 5-325 MG per tablet  Commonly known as:  PERCOCET/ROXICET  Take 1-2 tablets by mouth every 4 (four) hours as needed for severe pain.     prenatal multivitamin Tabs tablet  Take 1 tablet by mouth daily at 12 noon.     pseudoephedrine 30 MG tablet  Commonly known as:  SUDAFED  Take 60 mg by mouth every 4 (four) hours as needed for congestion.     zolpidem 10 MG tablet  Commonly known as:  AMBIEN  Take 1 tablet (10 mg total) by mouth at bedtime as needed for sleep.         Signed: Loni Delbridge C 09/18/2013, 11:12 AM

## 2013-09-30 ENCOUNTER — Encounter (HOSPITAL_COMMUNITY): Payer: Self-pay | Admitting: *Deleted

## 2013-09-30 ENCOUNTER — Inpatient Hospital Stay (HOSPITAL_COMMUNITY)
Admission: AD | Admit: 2013-09-30 | Discharge: 2013-09-30 | Disposition: A | Discharge status: Home / Self Care | Payer: BC Managed Care – PPO | Source: Ambulatory Visit | Attending: Obstetrics and Gynecology | Admitting: Obstetrics and Gynecology

## 2013-09-30 DIAGNOSIS — O4700 False labor before 37 completed weeks of gestation, unspecified trimester: Secondary | ICD-10-CM | POA: Insufficient documentation

## 2013-09-30 NOTE — MAU Note (Signed)
C/o ? Labor- pain started about a hour ago;

## 2013-09-30 NOTE — MAU Provider Note (Signed)
Ms. Casey [name] is a 37 y.o. G3P1011 at [redacted]w[redacted]d who presents to MAU today with complaint of contractions and ?ROM. The patient states that the contractions are frequent and that she noted earlier today that she felt wet in her underwear. She has not had a gush of fluid or any vaginal bleeding. She reports good fetal movement.   BP 119/82  Pulse 108  Temp(Src) 97.8 F (36.6 C) (Oral)  Resp 18 GENERAL: Well-developed, well-nourished female in no acute distress.  HEENT: Normocephalic, atraumatic. LUNGS: Effort normal HEART: Regular rate  PELVIC: Normal external female genitalia. Vagina is pink and rugated.  Small amount of thick mucus discharge noted. No pooling. Normal cervix contour. Uterus is gravid and appropriate size for GA.   EXTREMITIES: No cyanosis, clubbing, or edema Dilation: 1.5 Effacement (%): 50 Station: -3 Exam by:: Naaman Plummer NP  Negative - ferning  Fetal Monitoring: Baseline: 130 bpm, moderate variability, + accelerations, no decelerations Contractions: q 2-3 minutes  A: Membranes intact Uterine contractions  P: RN to contact MD for further management  Freddi Starr, PA-C 09/30/2013 4:37 PM

## 2013-10-03 ENCOUNTER — Inpatient Hospital Stay (HOSPITAL_COMMUNITY)
Admission: AD | Admit: 2013-10-03 | Discharge: 2013-10-06 | DRG: 775 | Disposition: A | Discharge status: Home / Self Care | Payer: BC Managed Care – PPO | Source: Ambulatory Visit | Attending: Obstetrics & Gynecology | Admitting: Obstetrics & Gynecology

## 2013-10-03 ENCOUNTER — Encounter (HOSPITAL_COMMUNITY): Payer: Self-pay | Admitting: Obstetrics

## 2013-10-03 DIAGNOSIS — O4290 Premature rupture of membranes, unspecified as to length of time between rupture and onset of labor, unspecified weeks of gestation: Secondary | ICD-10-CM | POA: Diagnosis present

## 2013-10-03 DIAGNOSIS — O09529 Supervision of elderly multigravida, unspecified trimester: Secondary | ICD-10-CM | POA: Diagnosis present

## 2013-10-03 DIAGNOSIS — Z3490 Encounter for supervision of normal pregnancy, unspecified, unspecified trimester: Secondary | ICD-10-CM

## 2013-10-03 LAB — CBC
HCT: 33.6 % — ABNORMAL LOW (ref 36.0–46.0)
Hemoglobin: 11.5 g/dL — ABNORMAL LOW (ref 12.0–15.0)
MCH: 31.8 pg (ref 26.0–34.0)
MCHC: 34.2 g/dL (ref 30.0–36.0)
MCV: 92.8 fL (ref 78.0–100.0)
Platelets: 243 10*3/uL (ref 150–400)
RBC: 3.62 MIL/uL — ABNORMAL LOW (ref 3.87–5.11)
RDW: 13.3 % (ref 11.5–15.5)

## 2013-10-03 MED ORDER — ONDANSETRON HCL 4 MG/2ML IJ SOLN: 4.0000 mg | Freq: Four times a day (QID) | INTRAMUSCULAR | Status: DC | PRN

## 2013-10-03 MED ORDER — PENICILLIN G POTASSIUM 5000000 UNITS IJ SOLR
5.0000 10*6.[IU] | Freq: Once | INTRAVENOUS | Status: AC
  Administered 2013-10-03: 5 10*6.[IU] via INTRAVENOUS
  Filled 2013-10-03: qty 5

## 2013-10-03 MED ORDER — ACETAMINOPHEN 325 MG PO TABS: 650.0000 mg | ORAL_TABLET | ORAL | Status: DC | PRN

## 2013-10-03 MED ORDER — OXYCODONE-ACETAMINOPHEN 5-325 MG PO TABS: 1.0000 | ORAL_TABLET | ORAL | Status: DC | PRN

## 2013-10-03 MED ORDER — OXYTOCIN BOLUS FROM INFUSION: 500.0000 mL | INTRAVENOUS | Status: DC

## 2013-10-03 MED ORDER — OXYTOCIN 40 UNITS IN LACTATED RINGERS INFUSION - SIMPLE MED: 62.5000 mL/h | INTRAVENOUS | Status: DC

## 2013-10-03 MED ORDER — IBUPROFEN 600 MG PO TABS: 600.0000 mg | ORAL_TABLET | Freq: Four times a day (QID) | ORAL | Status: DC | PRN

## 2013-10-03 MED ORDER — LACTATED RINGERS IV SOLN
INTRAVENOUS | Status: DC
  Administered 2013-10-03: 23:00:00 via INTRAVENOUS

## 2013-10-03 MED ORDER — LIDOCAINE HCL (PF) 1 % IJ SOLN
30.0000 mL | INTRAMUSCULAR | Status: DC | PRN
  Filled 2013-10-03: qty 30

## 2013-10-03 MED ORDER — PENICILLIN G POTASSIUM 5000000 UNITS IJ SOLR
2.5000 10*6.[IU] | INTRAVENOUS | Status: DC
  Administered 2013-10-04 (×2): 2.5 10*6.[IU] via INTRAVENOUS
  Filled 2013-10-03 (×4): qty 2.5

## 2013-10-03 MED ORDER — LACTATED RINGERS IV SOLN: 500.0000 mL | INTRAVENOUS | Status: DC | PRN

## 2013-10-03 MED ORDER — CITRIC ACID-SODIUM CITRATE 334-500 MG/5ML PO SOLN: 30.0000 mL | ORAL | Status: DC | PRN

## 2013-10-03 NOTE — MAU Note (Signed)
Pt reports ROM at 2030, clear fluid. Some contractions

## 2013-10-03 NOTE — MAU Note (Signed)
To L/D for eval of rupture of membranes.

## 2013-10-04 ENCOUNTER — Encounter (HOSPITAL_COMMUNITY): Payer: BC Managed Care – PPO | Admitting: Anesthesiology

## 2013-10-04 ENCOUNTER — Encounter (HOSPITAL_COMMUNITY): Payer: Self-pay | Admitting: *Deleted

## 2013-10-04 ENCOUNTER — Inpatient Hospital Stay (HOSPITAL_COMMUNITY): Payer: BC Managed Care – PPO | Admitting: Anesthesiology

## 2013-10-04 DIAGNOSIS — Z3490 Encounter for supervision of normal pregnancy, unspecified, unspecified trimester: Secondary | ICD-10-CM

## 2013-10-04 LAB — OB RESULTS CONSOLE GBS: GBS: NEGATIVE

## 2013-10-04 MED ORDER — SIMETHICONE 80 MG PO CHEW: 80.0000 mg | CHEWABLE_TABLET | ORAL | Status: DC | PRN

## 2013-10-04 MED ORDER — TERBUTALINE SULFATE 1 MG/ML IJ SOLN: 0.2500 mg | Freq: Once | INTRAMUSCULAR | Status: DC | PRN

## 2013-10-04 MED ORDER — SENNOSIDES-DOCUSATE SODIUM 8.6-50 MG PO TABS
2.0000 | ORAL_TABLET | ORAL | Status: DC
  Administered 2013-10-04 – 2013-10-05 (×2): 2 via ORAL
  Filled 2013-10-04 (×2): qty 2

## 2013-10-04 MED ORDER — PHENYLEPHRINE 40 MCG/ML (10ML) SYRINGE FOR IV PUSH (FOR BLOOD PRESSURE SUPPORT): 80.0000 ug | PREFILLED_SYRINGE | INTRAVENOUS | Status: DC | PRN

## 2013-10-04 MED ORDER — BENZOCAINE-MENTHOL 20-0.5 % EX AERO
1.0000 "application " | INHALATION_SPRAY | CUTANEOUS | Status: DC | PRN
  Administered 2013-10-04: 1 via TOPICAL
  Filled 2013-10-04: qty 56

## 2013-10-04 MED ORDER — ZOLPIDEM TARTRATE 5 MG PO TABS: 5.0000 mg | ORAL_TABLET | Freq: Every evening | ORAL | Status: DC | PRN

## 2013-10-04 MED ORDER — WITCH HAZEL-GLYCERIN EX PADS: 1.0000 "application " | MEDICATED_PAD | CUTANEOUS | Status: DC | PRN

## 2013-10-04 MED ORDER — ONDANSETRON HCL 4 MG PO TABS: 4.0000 mg | ORAL_TABLET | ORAL | Status: DC | PRN

## 2013-10-04 MED ORDER — EPHEDRINE 5 MG/ML INJ: 10.0000 mg | INTRAVENOUS | Status: DC | PRN

## 2013-10-04 MED ORDER — FENTANYL 2.5 MCG/ML BUPIVACAINE 1/10 % EPIDURAL INFUSION (WH - ANES)
INTRAMUSCULAR | Status: DC | PRN
  Administered 2013-10-04: 14 mL/h via EPIDURAL

## 2013-10-04 MED ORDER — LIDOCAINE HCL (PF) 1 % IJ SOLN
INTRAMUSCULAR | Status: DC | PRN
  Administered 2013-10-04 (×2): 4 mL

## 2013-10-04 MED ORDER — PHENYLEPHRINE 40 MCG/ML (10ML) SYRINGE FOR IV PUSH (FOR BLOOD PRESSURE SUPPORT)
80.0000 ug | PREFILLED_SYRINGE | INTRAVENOUS | Status: DC | PRN
  Filled 2013-10-04: qty 10

## 2013-10-04 MED ORDER — OXYCODONE-ACETAMINOPHEN 5-325 MG PO TABS
1.0000 | ORAL_TABLET | ORAL | Status: DC | PRN
  Administered 2013-10-04 – 2013-10-06 (×6): 1 via ORAL
  Filled 2013-10-04 (×6): qty 1

## 2013-10-04 MED ORDER — PRENATAL MULTIVITAMIN CH
1.0000 | ORAL_TABLET | Freq: Every day | ORAL | Status: DC
  Administered 2013-10-05 – 2013-10-06 (×2): 1 via ORAL
  Filled 2013-10-04 (×2): qty 1

## 2013-10-04 MED ORDER — OXYTOCIN 40 UNITS IN LACTATED RINGERS INFUSION - SIMPLE MED
1.0000 m[IU]/min | INTRAVENOUS | Status: DC
  Administered 2013-10-04: 2 m[IU]/min via INTRAVENOUS
  Filled 2013-10-04: qty 1000

## 2013-10-04 MED ORDER — DIBUCAINE 1 % RE OINT: 1.0000 "application " | TOPICAL_OINTMENT | RECTAL | Status: DC | PRN

## 2013-10-04 MED ORDER — OXYTOCIN 40 UNITS IN LACTATED RINGERS INFUSION - SIMPLE MED: 1.0000 m[IU]/min | INTRAVENOUS | Status: DC

## 2013-10-04 MED ORDER — FENTANYL 2.5 MCG/ML BUPIVACAINE 1/10 % EPIDURAL INFUSION (WH - ANES)
14.0000 mL/h | INTRAMUSCULAR | Status: DC | PRN
  Administered 2013-10-04: 14 mL/h via EPIDURAL
  Filled 2013-10-04 (×2): qty 125

## 2013-10-04 MED ORDER — DIPHENHYDRAMINE HCL 50 MG/ML IJ SOLN
12.5000 mg | INTRAMUSCULAR | Status: DC | PRN
  Administered 2013-10-04: 12.5 mg via INTRAVENOUS
  Filled 2013-10-04: qty 1

## 2013-10-04 MED ORDER — LACTATED RINGERS IV SOLN: 500.0000 mL | Freq: Once | INTRAVENOUS | Status: DC

## 2013-10-04 MED ORDER — IBUPROFEN 600 MG PO TABS
600.0000 mg | ORAL_TABLET | Freq: Four times a day (QID) | ORAL | Status: DC
  Administered 2013-10-04 – 2013-10-06 (×9): 600 mg via ORAL
  Filled 2013-10-04 (×9): qty 1

## 2013-10-04 MED ORDER — TETANUS-DIPHTH-ACELL PERTUSSIS 5-2.5-18.5 LF-MCG/0.5 IM SUSP: 0.5000 mL | Freq: Once | INTRAMUSCULAR | Status: DC

## 2013-10-04 MED ORDER — LANOLIN HYDROUS EX OINT: TOPICAL_OINTMENT | CUTANEOUS | Status: DC | PRN

## 2013-10-04 MED ORDER — DIPHENHYDRAMINE HCL 25 MG PO CAPS: 25.0000 mg | ORAL_CAPSULE | Freq: Four times a day (QID) | ORAL | Status: DC | PRN

## 2013-10-04 MED ORDER — ONDANSETRON HCL 4 MG/2ML IJ SOLN: 4.0000 mg | INTRAMUSCULAR | Status: DC | PRN

## 2013-10-04 MED ORDER — EPHEDRINE 5 MG/ML INJ
10.0000 mg | INTRAVENOUS | Status: DC | PRN
  Filled 2013-10-04: qty 4

## 2013-10-04 NOTE — Anesthesia Procedure Notes (Signed)
Epidural Patient location during procedure: OB Start time: 10/04/2013 2:55 AM  Staffing Anesthesiologist: Julus Kelley A. Performed by: anesthesiologist   Preanesthetic Checklist Completed: patient identified, site marked, surgical consent, pre-op evaluation, timeout performed, IV checked, risks and benefits discussed and monitors and equipment checked  Epidural Patient position: sitting Prep: site prepped and draped and DuraPrep Patient monitoring: continuous pulse ox and blood pressure Approach: midline Injection technique: LOR air  Needle:  Needle type: Tuohy  Needle gauge: 17 G Needle length: 9 cm and 9 Needle insertion depth: 4 cm Catheter type: closed end flexible Catheter size: 19 Gauge Catheter at skin depth: 9 cm Test dose: negative and Other  Assessment Events: blood not aspirated, injection not painful, no injection resistance, negative IV test and no paresthesia  Additional Notes Patient identified. Risks and benefits discussed including failed block, incomplete  Pain control, post dural puncture headache, nerve damage, paralysis, blood pressure Changes, nausea, vomiting, reactions to medications-both toxic and allergic and post Partum back pain. All questions were answered. Patient expressed understanding and wished to proceed. Sterile technique was used throughout procedure. Epidural site was Dressed with sterile barrier dressing. No paresthesias, signs of intravascular injection Or signs of intrathecal spread were encountered.  Patient was more comfortable after the epidural was dosed. Please see RN's note for documentation of vital signs and FHR which are stable.

## 2013-10-04 NOTE — H&P (Signed)
Casey [name] is a 37 y.o. female presenting for PROM at 830 pm last night.  She subsequently began having contractions but mild and irregular.  She was started on pitocin this am and received an epidural.  Her pain is well controlled.  Antepartum course is complicated by AMA; pt declined aneuploidy testing.  She also has been admitted to antepartum for RLQ pain with negative work-up.  The patient reports the pain has recently improved and she believes it was related to the baby's position.     Maternal Medical History:  Reason for admission: Rupture of membranes.   Contractions: Onset was 13-24 hours ago.   Frequency: regular.   Perceived severity is moderate.    Fetal activity: Perceived fetal activity is normal.   Last perceived fetal movement was within the past hour.    Prenatal complications: no prenatal complications Prenatal Complications - Diabetes: none.    OB History   Grav Para Term Preterm Abortions TAB SAB Ect Mult Living   3 1 1  1  1   1      Past Medical History  Diagnosis Date  . History of kidney stones    Past Surgical History  Procedure Laterality Date  . Wisdom tooth extraction    . Dilation and curettage of uterus     Family History: family history is negative for Alcohol abuse, Arthritis, Asthma, Birth defects, Cancer, COPD, Depression, Diabetes, Drug abuse, Early death, Hearing loss, Heart disease, Hyperlipidemia, Hypertension, Kidney disease, Learning disabilities, Mental illness, Mental retardation, Miscarriages / Stillbirths, Stroke, Vision loss, and Varicose Veins. Social History:  reports that she has never smoked. She has never used smokeless tobacco. She reports that she does not drink alcohol or use illicit drugs.   Prenatal Transfer Tool  Maternal Diabetes: No Genetic Screening: Declined Maternal Ultrasounds/Referrals: Normal Fetal Ultrasounds or other Referrals:  None Maternal Substance Abuse:  No Significant Maternal Medications:   None Significant Maternal Lab Results:  Lab values include: Group B Strep negative Other Comments:  None  ROS  Dilation: 4 Effacement (%): 70 Station: -2 Exam by:: c soliz rn Blood pressure 88/52, pulse 104, temperature 98.3 F (36.8 C), temperature source Oral, resp. rate 20, height 5\' 8"  (1.727 m), weight 179 lb (81.194 kg), SpO2 99.00%. Maternal Exam:  Uterine Assessment: Contraction strength is moderate.  Contraction frequency is regular.   Abdomen: Patient reports no abdominal tenderness. Fundal height is c/w dates.   Estimated fetal weight is 7#6.       Physical Exam  Constitutional: She is oriented to person, place, and time. She appears well-developed and well-nourished.  GI: Soft. There is no rebound and no guarding.  Neurological: She is alert and oriented to person, place, and time.  Skin: Skin is warm and dry.  Psychiatric: She has a normal mood and affect. Her behavior is normal.    Prenatal labs: ABO, Rh:   Antibody:   Rubella:   RPR: NON REACTIVE (12/29 2250)  HBsAg:    HIV:    GBS: Negative (12/30 0000)   Assessment/Plan: 37yo G3P1011 at [redacted]w[redacted]d with PROM -Continue pitocin augmentation -Anticipate NSVD   Casey [name] 10/04/2013, 10:01 AM

## 2013-10-04 NOTE — Anesthesia Preprocedure Evaluation (Signed)
Anesthesia Evaluation  Patient identified by MRN, date of birth, ID band Patient awake    Reviewed: Allergy & Precautions, H&P , Patient's Chart, lab work & pertinent test results  Airway Mallampati: III TM Distance: >3 FB Neck ROM: full    Dental no notable dental hx. (+) Teeth Intact   Pulmonary neg pulmonary ROS,  breath sounds clear to auscultation  Pulmonary exam normal       Cardiovascular negative cardio ROS  Rhythm:regular Rate:Normal     Neuro/Psych negative neurological ROS  negative psych ROS   GI/Hepatic Neg liver ROS, GERD-  Medicated and Controlled,  Endo/Other  negative endocrine ROS  Renal/GU negative Renal ROS  negative genitourinary   Musculoskeletal   Abdominal Normal abdominal exam  (+)   Peds  Hematology negative hematology ROS (+)   Anesthesia Other Findings   Reproductive/Obstetrics (+) Pregnancy                          Anesthesia Physical Anesthesia Plan  ASA: II  Anesthesia Plan: Epidural   Post-op Pain Management:    Induction:   Airway Management Planned:   Additional Equipment:   Intra-op Plan:   Post-operative Plan:   Informed Consent: I have reviewed the patients History and Physical, chart, labs and discussed the procedure including the risks, benefits and alternatives for the proposed anesthesia with the patient or authorized representative who has indicated his/her understanding and acceptance.     Plan Discussed with: Anesthesiologist  Anesthesia Plan Comments:         Anesthesia Quick Evaluation   

## 2013-10-04 NOTE — Anesthesia Postprocedure Evaluation (Signed)
  Anesthesia Post-op Note  Patient: Casey [name]  Procedure(s) Performed: * No procedures listed *  Patient Location: Mother/Baby  Anesthesia Type:Epidural  Level of Consciousness: awake, alert , oriented and patient cooperative  Airway and Oxygen Therapy: Patient Spontanous Breathing  Post-op Pain: mild  Post-op Assessment: Patient's Cardiovascular Status Stable, Respiratory Function Stable, No headache, No backache, No residual numbness and No residual motor weakness  Post-op Vital Signs: stable  Complications: No apparent anesthesia complications

## 2013-10-04 NOTE — Lactation Note (Signed)
This note was copied from the chart of Casey [name]. Lactation Consultation Note  Patient Name: Casey [name] Date: 10/04/2013 Reason for consult: Initial assessment Baby 9 hours old. Mom reports breastfeeding going well. Had questions offering one of both breasts, latching--reviewed basics. Enc mom to feed on cue, STS, and hand or manually pre-pump to pull out right nipple and stimulate let-down. Mom enc to call for assistance from staff or LC. [facility] resource brochure given and reviewed including OP and BFSG services.  Maternal Data Formula Feeding for Exclusion: No Infant to breast within first hour of birth: Yes Has patient been taught Hand Expression?: Yes Does the patient have breastfeeding experience prior to this delivery?: Yes  Feeding Feeding Type: Breast Fed Length of feed: 5 min  LATCH Score/Interventions                      Lactation Tools Discussed/Used Date initiated:: 10/04/13  Consult Status Consult Status: Follow-up Follow-up type: In-patient    Casey [name] 10/04/2013, 8:34 PM

## 2013-10-05 LAB — CBC
HCT: 31.3 % — ABNORMAL LOW (ref 36.0–46.0)
Hemoglobin: 10.6 g/dL — ABNORMAL LOW (ref 12.0–15.0)
MCV: 94.3 fL (ref 78.0–100.0)
RBC: 3.32 MIL/uL — ABNORMAL LOW (ref 3.87–5.11)
RDW: 13.8 % (ref 11.5–15.5)
WBC: 11.2 10*3/uL — ABNORMAL HIGH (ref 4.0–10.5)

## 2013-10-05 NOTE — Progress Notes (Signed)
Post Partum Day one Subjective: no complaints  Objective: Blood pressure 103/71, pulse 76, temperature 98 F (36.7 C), temperature source Oral, resp. rate 20, height 5\' 8"  (1.727 m), weight 81.194 kg (179 lb), SpO2 99.00%, unknown if currently breastfeeding.  Physical Exam:  General: alert Lochia: appropriate Uterine Fundus: firm Incision: healing well DVT Evaluation: No evidence of DVT seen on physical exam.   Recent Labs  10/03/13 2250 10/05/13 0556  HGB 11.5* 10.6*  HCT 33.6* 31.3*    Assessment/Plan: Plan for discharge tomorrow   LOS: 2 days   Casey [name] S 10/05/2013, 8:26 AM

## 2013-10-05 NOTE — Lactation Note (Signed)
This note was copied from the chart of [name]. Lactation Consultation Note  Patient Name: [name] [id number] Date: 10/05/2013 Reason for consult: Follow-up assessment Poor feedings and difficult latch. Clearance Coots is not sustaining her latch at the breast, she suckles 4-5 times with strong tugs and then slips off the breast. Mother's nipples are becoming irritated and a small compression stripe noted after the brief feedings. Clearance Coots is showing feeding cues and eager to feed. #20 NS applied after teaching with parents. Baby sustained latch better with shield after a few attempts and fed x 5 minutes. Oral exam and suck assessment with gloved finger reveals that baby has a heart shaped tongue with limited extension over gum line. Baby bites down on finger before having a shallow suck and minimal cupping of tongue around finger. Mother has lots of colostrum. Hand expressed colostrum onto a teaspoon and fed baby. She had difficulty extending her tongue to lap milk. Spoon tilted to assist baby. Discussed findings with parents as they were able to observe with spoon feeding. Will have LC and nursing to continue to assist with feedings using a nipple shield. Parents to observe infants behavior and discuss feeding difficulties with MD if situation does not improve. Mother had difficulty breastfeeding her previous baby and pumped for 2 months. Informed of outpatient lactation services after discharge.  Maternal Data    Feeding Feeding Type: Breast Fed Length of feed: 5 min  LATCH Score/Interventions Latch: Repeated attempts needed to sustain latch, nipple held in mouth throughout feeding, stimulation needed to elicit sucking reflex.  Audible Swallowing: A few with stimulation Intervention(s): Skin to skin;Hand expression;Alternate breast massage  Type of Nipple: Everted at rest and after stimulation (left nipple everted, right nipple flat)  Comfort (Breast/Nipple): Soft / non-tender (left  nipple developing a compression stripe)     Hold (Positioning): Assistance needed to correctly position infant at breast and maintain latch.  LATCH Score: 7  Lactation Tools Discussed/Used Tools: Nipple Shields Nipple shield size: 20   Consult Status Date: 10/06/13 Follow-up type: In-patient    Christella Hartigan M 10/05/2013, 9:58 AM

## 2013-10-06 MED ORDER — OXYCODONE-ACETAMINOPHEN 7.5-325 MG PO TABS: 1.0000 | ORAL_TABLET | ORAL | Status: DC | PRN

## 2013-10-06 NOTE — Discharge Summary (Signed)
Obstetric Discharge Summary Reason for Admission: onset of labor Prenatal Procedures: none Intrapartum Procedures: spontaneous vaginal delivery Postpartum Procedures: none Complications-Operative and Postpartum: second degree laceration with repair Hemoglobin  Date Value Range Status  10/05/2013 10.6* 12.0 - 15.0 g/dL Final     HCT  Date Value Range Status  10/05/2013 31.3* 36.0 - 46.0 % Final    Physical Exam:  General: alert Lochia: appropriate Uterine Fundus: firm Incision: healing well DVT Evaluation: No evidence of DVT seen on physical exam.  Discharge Diagnoses: Term Pregnancy-delivered  Discharge Information: Date: 10/06/2013 Activity: pelvic rest Diet: routine Medications: PNV, Ibuprofen and Percocet Condition: stable Instructions: refer to practice specific booklet Discharge to: home   Newborn Data: Live born female  Birth Weight: 6 lb 14.8 oz (3140 g) APGAR: 9, 9  Home with mother.  Casey [name] S 10/06/2013, 8:54 AM

## 2013-10-06 NOTE — Lactation Note (Signed)
This note was copied from the chart of Casey [name]. Lactation Consultation Note: Mom reports that nursing had been going well until the last few feedings and right nipple has gotten really sore. Assisted mom with latch- using NS. She reports lots of pain with latch- adjusted baby's bottom lip and mom reports that feels much better. Reports that she is seeing Colostrum in the NS when the baby comes off the breast. Mom reports that baby is able to latch to left nipple without the NS and nurses well on that side. Discussed tight frenulum with mom and encouraged to watch for diaper changes, that breasts soften after nursing and baby is content after nursing, Offered OP appointment but mom refused at this time.States she will call if she needs one, No questions at present. To call prn.  Patient Name: Casey [name] [id number] Date: 10/06/2013 Reason for consult: Follow-up assessment   Maternal Data    Feeding Feeding Type: Breast Fed  LATCH Score/Interventions Latch: Grasps breast easily, tongue down, lips flanged, rhythmical sucking.  Audible Swallowing: A few with stimulation  Type of Nipple: Flat  Comfort (Breast/Nipple): Filling, red/small blisters or bruises, mild/mod discomfort  Problem noted: Mild/Moderate discomfort Interventions (Mild/moderate discomfort): Comfort gels  Hold (Positioning): Assistance needed to correctly position infant at breast and maintain latch.  LATCH Score: 6  Lactation Tools Discussed/Used Tools: Nipple Shields Nipple shield size: 20   Consult Status Consult Status: Complete    Truddie Crumble 10/06/2013, 9:01 AM

## 2014-08-07 ENCOUNTER — Encounter (HOSPITAL_COMMUNITY): Payer: Self-pay | Admitting: *Deleted

## 2015-03-20 IMAGING — US US RENAL
1 series · 14 of 25 positions shown · non-contrast
Comparison: CT 09/12/2013.

CLINICAL DATA: Pain.

EXAM:
RENAL/URINARY TRACT ULTRASOUND COMPLETE

[Series 1: us renal · 14 of 28 slices shown]
[im 1/28]
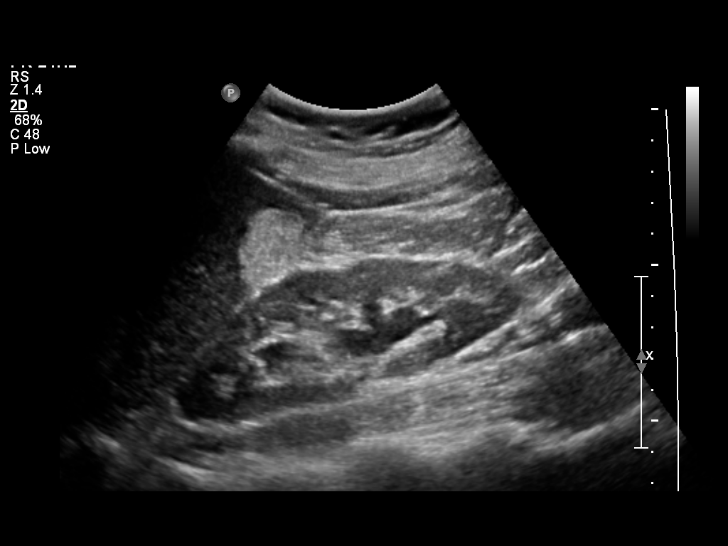
[im 3/28]
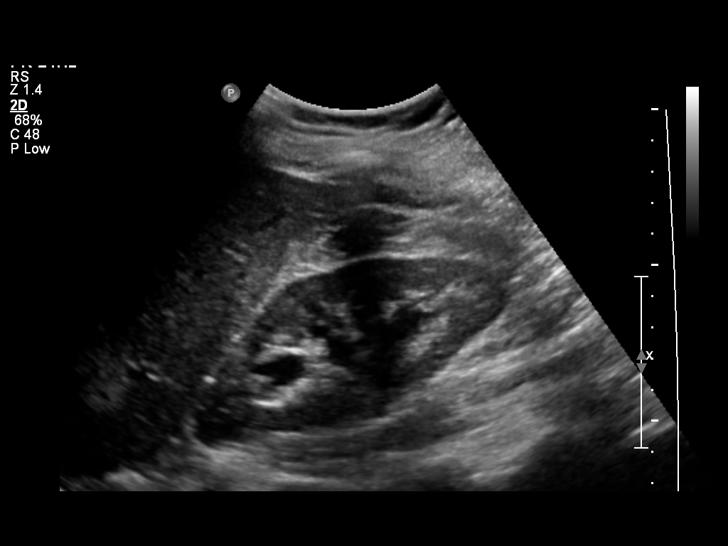
[im 5/28]
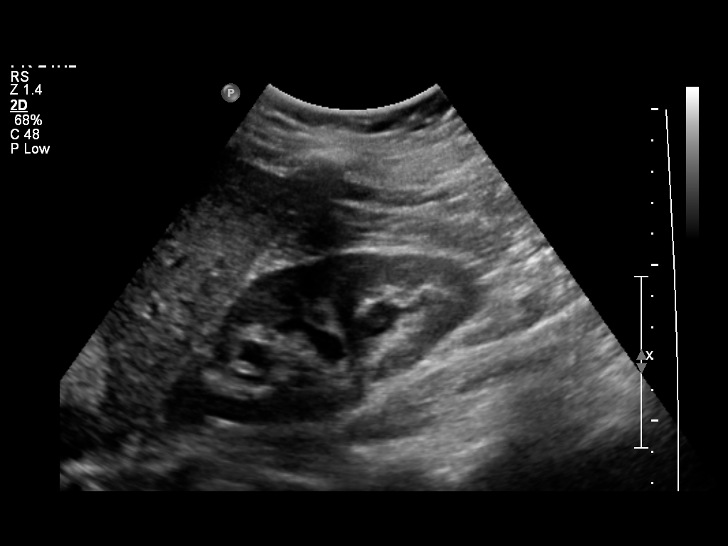
[im 7/28]
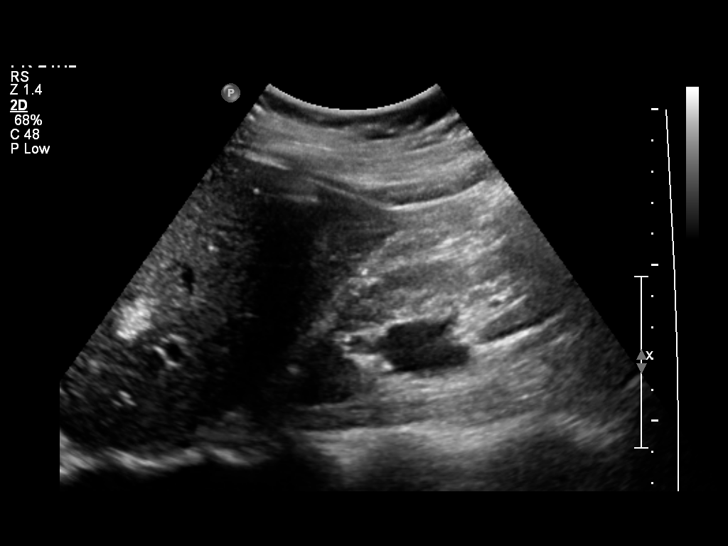
[im 10/28]
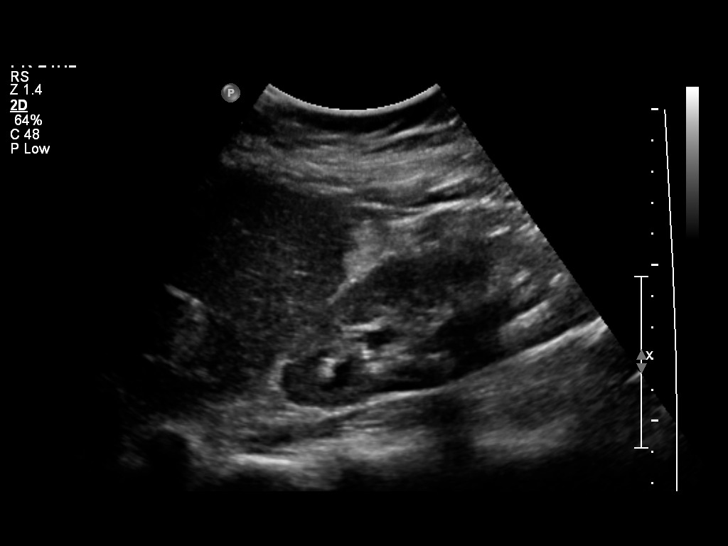
[im 11/28]
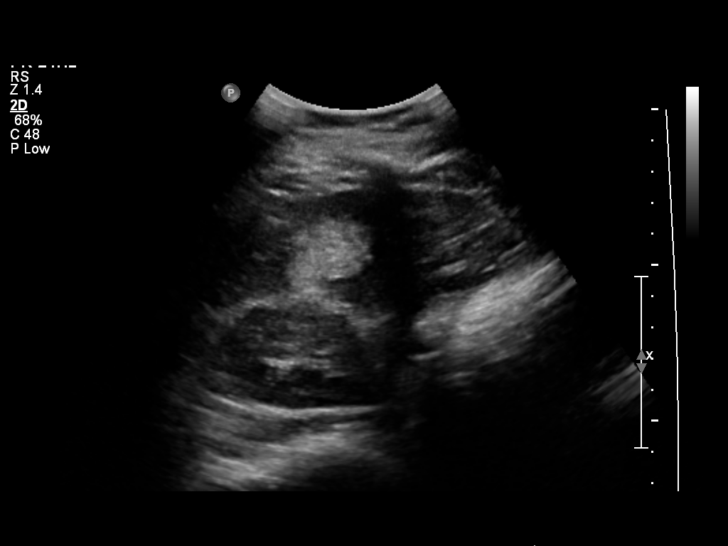
[im 13/28]
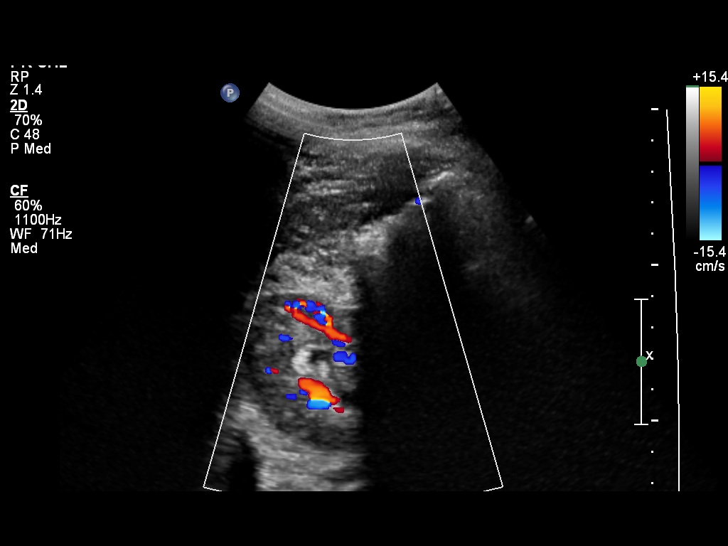
[im 15/28]
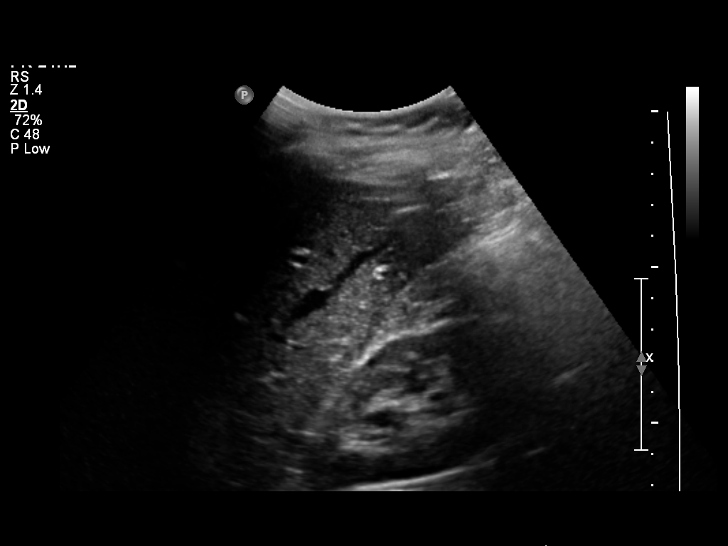
[im 17/28]
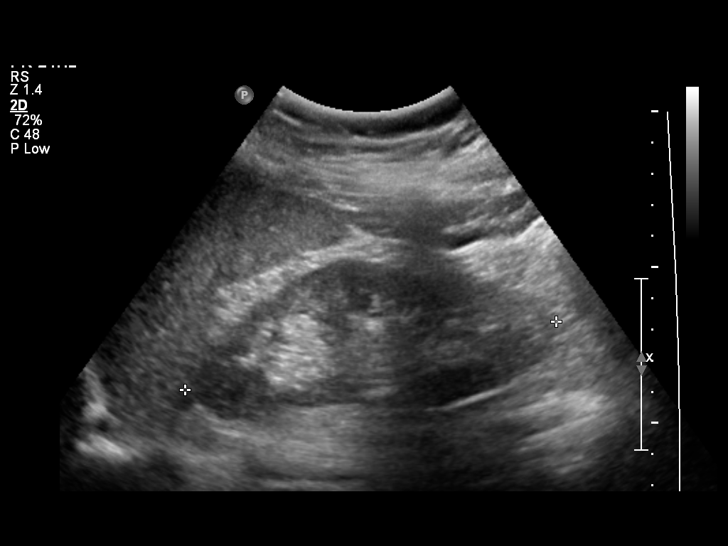
[im 19/28]
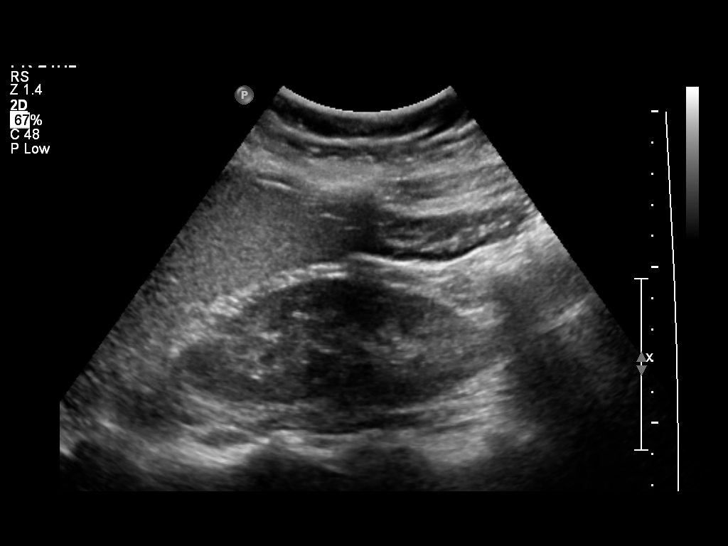
[im 21/28]
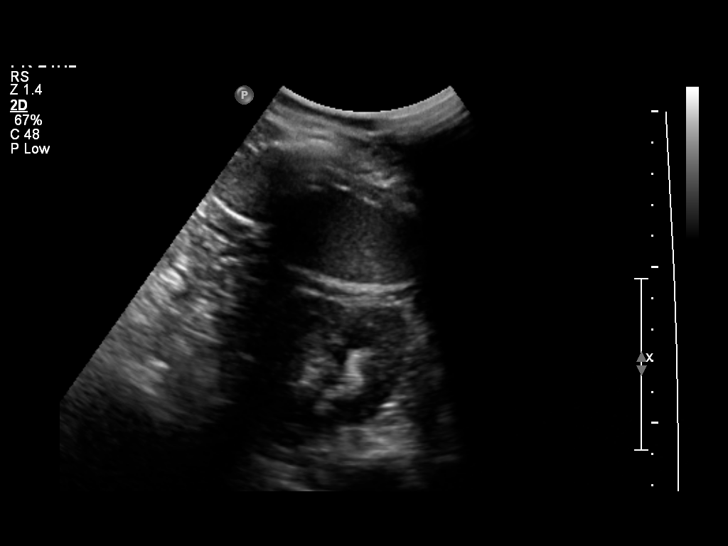
[im 23/28]
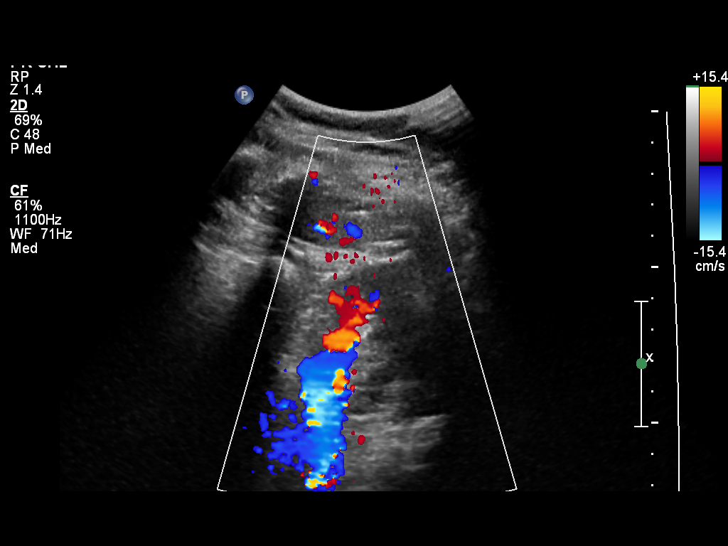
[im 25/28]
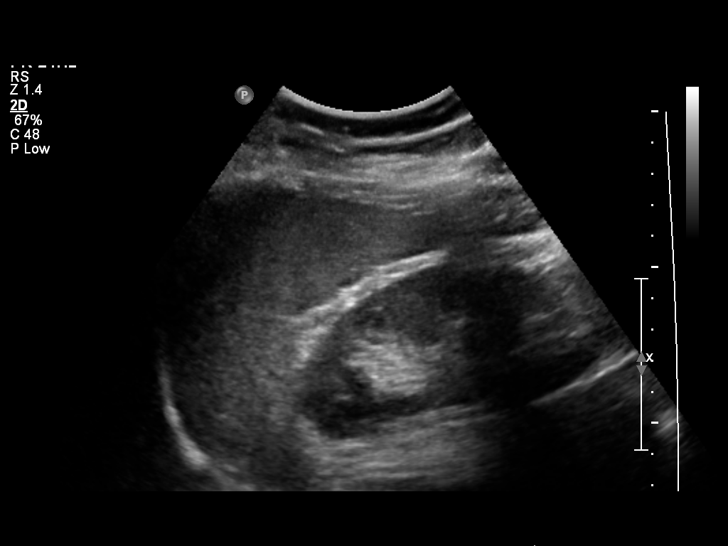
[im 28/28]
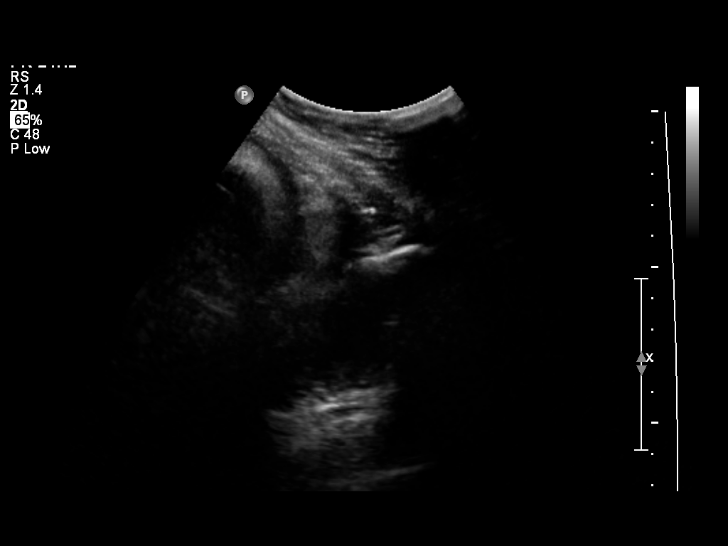

[14 of 25 positions shown; findings below may reference images not displayed]

FINDINGS: Right Kidney:

Length: 12.3 cm. Mild right hydronephrosis. Normal renal cortical
thickness and echogenicity.

Left Kidney:

Length: 12.1 cm. Echogenicity within normal limits. No mass or
hydronephrosis visualized.

Bladder:

Appears normal for degree of bladder distention.
IMPRESSION: Persistent mild right hydronephrosis, likely secondary to the gravid
state of the uterus. This is grossly similar to prior CT
examination.

## 2015-05-22 ENCOUNTER — Other Ambulatory Visit: Payer: Self-pay | Admitting: Nurse Practitioner

## 2015-05-22 DIAGNOSIS — N644 Mastodynia: Secondary | ICD-10-CM

## 2015-05-22 DIAGNOSIS — N6452 Nipple discharge: Secondary | ICD-10-CM

## 2015-05-28 ENCOUNTER — Other Ambulatory Visit: Payer: Medicaid Other

## 2015-06-01 ENCOUNTER — Other Ambulatory Visit: Payer: Medicaid Other

## 2015-06-08 ENCOUNTER — Ambulatory Visit
Admission: RE | Admit: 2015-06-08 | Discharge: 2015-06-08 | Disposition: A | Discharge status: Home / Self Care | Payer: Medicaid Other | Source: Ambulatory Visit | Attending: Nurse Practitioner | Admitting: Nurse Practitioner

## 2015-06-08 ENCOUNTER — Ambulatory Visit
Admission: RE | Admit: 2015-06-08 | Discharge: 2015-06-08 | Disposition: A | Discharge status: Home / Self Care | Payer: Managed Care, Other (non HMO) | Source: Ambulatory Visit | Attending: Nurse Practitioner | Admitting: Nurse Practitioner

## 2015-06-08 DIAGNOSIS — N6452 Nipple discharge: Secondary | ICD-10-CM

## 2015-06-08 DIAGNOSIS — N644 Mastodynia: Secondary | ICD-10-CM

## 2016-03-12 DIAGNOSIS — N390 Urinary tract infection, site not specified: Secondary | ICD-10-CM | POA: Diagnosis not present

## 2016-03-12 DIAGNOSIS — R8290 Unspecified abnormal findings in urine: Secondary | ICD-10-CM | POA: Diagnosis not present

## 2016-03-12 DIAGNOSIS — Z0000 Encounter for general adult medical examination without abnormal findings: Secondary | ICD-10-CM | POA: Diagnosis not present

## 2016-03-17 DIAGNOSIS — Z1389 Encounter for screening for other disorder: Secondary | ICD-10-CM | POA: Diagnosis not present

## 2016-03-17 DIAGNOSIS — G43909 Migraine, unspecified, not intractable, without status migrainosus: Secondary | ICD-10-CM | POA: Diagnosis not present

## 2016-03-17 DIAGNOSIS — F418 Other specified anxiety disorders: Secondary | ICD-10-CM | POA: Diagnosis not present

## 2016-03-17 DIAGNOSIS — F900 Attention-deficit hyperactivity disorder, predominantly inattentive type: Secondary | ICD-10-CM | POA: Diagnosis not present

## 2016-03-17 DIAGNOSIS — Z0000 Encounter for general adult medical examination without abnormal findings: Secondary | ICD-10-CM | POA: Diagnosis not present

## 2016-07-28 DIAGNOSIS — L718 Other rosacea: Secondary | ICD-10-CM | POA: Diagnosis not present

## 2016-08-05 DIAGNOSIS — Z6821 Body mass index (BMI) 21.0-21.9, adult: Secondary | ICD-10-CM | POA: Diagnosis not present

## 2016-08-05 DIAGNOSIS — Z01419 Encounter for gynecological examination (general) (routine) without abnormal findings: Secondary | ICD-10-CM | POA: Diagnosis not present

## 2016-08-05 DIAGNOSIS — Z30432 Encounter for removal of intrauterine contraceptive device: Secondary | ICD-10-CM | POA: Diagnosis not present

## 2016-09-15 DIAGNOSIS — F900 Attention-deficit hyperactivity disorder, predominantly inattentive type: Secondary | ICD-10-CM | POA: Diagnosis not present

## 2016-09-15 DIAGNOSIS — F418 Other specified anxiety disorders: Secondary | ICD-10-CM | POA: Diagnosis not present

## 2016-09-15 DIAGNOSIS — Z6822 Body mass index (BMI) 22.0-22.9, adult: Secondary | ICD-10-CM | POA: Diagnosis not present

## 2016-09-15 DIAGNOSIS — G43909 Migraine, unspecified, not intractable, without status migrainosus: Secondary | ICD-10-CM | POA: Diagnosis not present

## 2017-01-15 DIAGNOSIS — L218 Other seborrheic dermatitis: Secondary | ICD-10-CM | POA: Diagnosis not present

## 2017-01-15 DIAGNOSIS — L718 Other rosacea: Secondary | ICD-10-CM | POA: Diagnosis not present

## 2017-03-10 DIAGNOSIS — R8299 Other abnormal findings in urine: Secondary | ICD-10-CM | POA: Diagnosis not present

## 2017-03-10 DIAGNOSIS — N390 Urinary tract infection, site not specified: Secondary | ICD-10-CM | POA: Diagnosis not present

## 2017-03-10 DIAGNOSIS — Z0000 Encounter for general adult medical examination without abnormal findings: Secondary | ICD-10-CM | POA: Diagnosis not present

## 2017-03-10 DIAGNOSIS — N200 Calculus of kidney: Secondary | ICD-10-CM | POA: Diagnosis not present

## 2017-03-19 DIAGNOSIS — Z1212 Encounter for screening for malignant neoplasm of rectum: Secondary | ICD-10-CM | POA: Diagnosis not present

## 2017-03-20 DIAGNOSIS — G43909 Migraine, unspecified, not intractable, without status migrainosus: Secondary | ICD-10-CM | POA: Diagnosis not present

## 2017-03-20 DIAGNOSIS — F418 Other specified anxiety disorders: Secondary | ICD-10-CM | POA: Diagnosis not present

## 2017-03-20 DIAGNOSIS — F900 Attention-deficit hyperactivity disorder, predominantly inattentive type: Secondary | ICD-10-CM | POA: Diagnosis not present

## 2017-03-20 DIAGNOSIS — Z1389 Encounter for screening for other disorder: Secondary | ICD-10-CM | POA: Diagnosis not present

## 2017-03-20 DIAGNOSIS — Z0000 Encounter for general adult medical examination without abnormal findings: Secondary | ICD-10-CM | POA: Diagnosis not present

## 2017-03-20 DIAGNOSIS — N200 Calculus of kidney: Secondary | ICD-10-CM | POA: Diagnosis not present

## 2017-05-01 DIAGNOSIS — R05 Cough: Secondary | ICD-10-CM | POA: Diagnosis not present

## 2017-05-01 DIAGNOSIS — J019 Acute sinusitis, unspecified: Secondary | ICD-10-CM | POA: Diagnosis not present

## 2017-05-01 DIAGNOSIS — Z6822 Body mass index (BMI) 22.0-22.9, adult: Secondary | ICD-10-CM | POA: Diagnosis not present

## 2017-08-24 DIAGNOSIS — Z1231 Encounter for screening mammogram for malignant neoplasm of breast: Secondary | ICD-10-CM | POA: Diagnosis not present

## 2017-08-24 DIAGNOSIS — Z6823 Body mass index (BMI) 23.0-23.9, adult: Secondary | ICD-10-CM | POA: Diagnosis not present

## 2017-08-24 DIAGNOSIS — N939 Abnormal uterine and vaginal bleeding, unspecified: Secondary | ICD-10-CM | POA: Diagnosis not present

## 2017-08-24 DIAGNOSIS — Z01419 Encounter for gynecological examination (general) (routine) without abnormal findings: Secondary | ICD-10-CM | POA: Diagnosis not present

## 2017-09-18 DIAGNOSIS — F418 Other specified anxiety disorders: Secondary | ICD-10-CM | POA: Diagnosis not present

## 2017-09-18 DIAGNOSIS — G43909 Migraine, unspecified, not intractable, without status migrainosus: Secondary | ICD-10-CM | POA: Diagnosis not present

## 2017-09-18 DIAGNOSIS — F900 Attention-deficit hyperactivity disorder, predominantly inattentive type: Secondary | ICD-10-CM | POA: Diagnosis not present

## 2017-09-18 DIAGNOSIS — Z6822 Body mass index (BMI) 22.0-22.9, adult: Secondary | ICD-10-CM | POA: Diagnosis not present

## 2018-03-26 DIAGNOSIS — Z0000 Encounter for general adult medical examination without abnormal findings: Secondary | ICD-10-CM | POA: Diagnosis not present

## 2018-04-02 DIAGNOSIS — Z0000 Encounter for general adult medical examination without abnormal findings: Secondary | ICD-10-CM | POA: Diagnosis not present

## 2018-04-02 DIAGNOSIS — F418 Other specified anxiety disorders: Secondary | ICD-10-CM | POA: Diagnosis not present

## 2018-04-02 DIAGNOSIS — D72819 Decreased white blood cell count, unspecified: Secondary | ICD-10-CM | POA: Diagnosis not present

## 2018-04-02 DIAGNOSIS — G43909 Migraine, unspecified, not intractable, without status migrainosus: Secondary | ICD-10-CM | POA: Diagnosis not present

## 2018-04-02 DIAGNOSIS — F900 Attention-deficit hyperactivity disorder, predominantly inattentive type: Secondary | ICD-10-CM | POA: Diagnosis not present

## 2018-04-02 DIAGNOSIS — Z1389 Encounter for screening for other disorder: Secondary | ICD-10-CM | POA: Diagnosis not present

## 2018-04-12 DIAGNOSIS — Z1212 Encounter for screening for malignant neoplasm of rectum: Secondary | ICD-10-CM | POA: Diagnosis not present

## 2018-05-21 DIAGNOSIS — Z91030 Bee allergy status: Secondary | ICD-10-CM | POA: Diagnosis not present

## 2018-05-21 DIAGNOSIS — R2242 Localized swelling, mass and lump, left lower limb: Secondary | ICD-10-CM | POA: Diagnosis not present

## 2018-05-21 DIAGNOSIS — Z6823 Body mass index (BMI) 23.0-23.9, adult: Secondary | ICD-10-CM | POA: Diagnosis not present

## 2018-08-24 DIAGNOSIS — N911 Secondary amenorrhea: Secondary | ICD-10-CM | POA: Diagnosis not present

## 2018-09-01 DIAGNOSIS — Z3481 Encounter for supervision of other normal pregnancy, first trimester: Secondary | ICD-10-CM | POA: Diagnosis not present

## 2018-09-01 DIAGNOSIS — Z23 Encounter for immunization: Secondary | ICD-10-CM | POA: Diagnosis not present

## 2018-09-10 DIAGNOSIS — Z113 Encounter for screening for infections with a predominantly sexual mode of transmission: Secondary | ICD-10-CM | POA: Diagnosis not present

## 2018-09-10 DIAGNOSIS — Z3480 Encounter for supervision of other normal pregnancy, unspecified trimester: Secondary | ICD-10-CM | POA: Diagnosis not present

## 2018-09-10 DIAGNOSIS — O021 Missed abortion: Secondary | ICD-10-CM | POA: Diagnosis not present

## 2018-09-13 DIAGNOSIS — O021 Missed abortion: Secondary | ICD-10-CM | POA: Diagnosis not present

## 2019-03-28 DIAGNOSIS — Z0000 Encounter for general adult medical examination without abnormal findings: Secondary | ICD-10-CM | POA: Diagnosis not present

## 2019-04-01 DIAGNOSIS — R82998 Other abnormal findings in urine: Secondary | ICD-10-CM | POA: Diagnosis not present

## 2019-04-04 DIAGNOSIS — F900 Attention-deficit hyperactivity disorder, predominantly inattentive type: Secondary | ICD-10-CM | POA: Diagnosis not present

## 2019-04-04 DIAGNOSIS — E785 Hyperlipidemia, unspecified: Secondary | ICD-10-CM | POA: Diagnosis not present

## 2019-04-04 DIAGNOSIS — Z1331 Encounter for screening for depression: Secondary | ICD-10-CM | POA: Diagnosis not present

## 2019-04-04 DIAGNOSIS — Z91030 Bee allergy status: Secondary | ICD-10-CM | POA: Diagnosis not present

## 2019-04-04 DIAGNOSIS — Z0000 Encounter for general adult medical examination without abnormal findings: Secondary | ICD-10-CM | POA: Diagnosis not present

## 2019-04-04 DIAGNOSIS — F418 Other specified anxiety disorders: Secondary | ICD-10-CM | POA: Diagnosis not present

## 2019-05-19 ENCOUNTER — Other Ambulatory Visit: Payer: Self-pay

## 2019-05-19 DIAGNOSIS — Z20822 Contact with and (suspected) exposure to covid-19: Secondary | ICD-10-CM

## 2019-05-21 LAB — SPECIMEN STATUS REPORT

## 2019-05-21 LAB — NOVEL CORONAVIRUS, NAA: SARS-CoV-2, NAA: NOT DETECTED

## 2019-06-18 DIAGNOSIS — Z23 Encounter for immunization: Secondary | ICD-10-CM | POA: Diagnosis not present

## 2019-09-23 DIAGNOSIS — N644 Mastodynia: Secondary | ICD-10-CM | POA: Diagnosis not present

## 2019-09-26 ENCOUNTER — Other Ambulatory Visit: Payer: Self-pay | Admitting: Obstetrics and Gynecology

## 2019-09-26 DIAGNOSIS — N644 Mastodynia: Secondary | ICD-10-CM

## 2019-10-10 ENCOUNTER — Other Ambulatory Visit: Payer: Managed Care, Other (non HMO)

## 2019-10-24 DIAGNOSIS — Z20828 Contact with and (suspected) exposure to other viral communicable diseases: Secondary | ICD-10-CM | POA: Diagnosis not present

## 2019-10-27 ENCOUNTER — Other Ambulatory Visit: Payer: Managed Care, Other (non HMO)

## 2019-11-25 DIAGNOSIS — Z01419 Encounter for gynecological examination (general) (routine) without abnormal findings: Secondary | ICD-10-CM | POA: Diagnosis not present

## 2019-11-25 DIAGNOSIS — Z6824 Body mass index (BMI) 24.0-24.9, adult: Secondary | ICD-10-CM | POA: Diagnosis not present

## 2019-11-25 DIAGNOSIS — Z1231 Encounter for screening mammogram for malignant neoplasm of breast: Secondary | ICD-10-CM | POA: Diagnosis not present

## 2020-01-13 DIAGNOSIS — Z20828 Contact with and (suspected) exposure to other viral communicable diseases: Secondary | ICD-10-CM | POA: Diagnosis not present

## 2020-03-29 DIAGNOSIS — E7849 Other hyperlipidemia: Secondary | ICD-10-CM | POA: Diagnosis not present

## 2020-03-29 DIAGNOSIS — Z0000 Encounter for general adult medical examination without abnormal findings: Secondary | ICD-10-CM | POA: Diagnosis not present

## 2020-04-05 DIAGNOSIS — F418 Other specified anxiety disorders: Secondary | ICD-10-CM | POA: Diagnosis not present

## 2020-04-05 DIAGNOSIS — R82998 Other abnormal findings in urine: Secondary | ICD-10-CM | POA: Diagnosis not present

## 2020-04-05 DIAGNOSIS — E785 Hyperlipidemia, unspecified: Secondary | ICD-10-CM | POA: Diagnosis not present

## 2020-04-05 DIAGNOSIS — Z0000 Encounter for general adult medical examination without abnormal findings: Secondary | ICD-10-CM | POA: Diagnosis not present

## 2020-04-05 DIAGNOSIS — Z91030 Bee allergy status: Secondary | ICD-10-CM | POA: Diagnosis not present

## 2020-04-05 DIAGNOSIS — Z1331 Encounter for screening for depression: Secondary | ICD-10-CM | POA: Diagnosis not present

## 2020-04-05 DIAGNOSIS — F900 Attention-deficit hyperactivity disorder, predominantly inattentive type: Secondary | ICD-10-CM | POA: Diagnosis not present

## 2020-04-23 DIAGNOSIS — J069 Acute upper respiratory infection, unspecified: Secondary | ICD-10-CM | POA: Diagnosis not present

## 2020-04-23 DIAGNOSIS — J029 Acute pharyngitis, unspecified: Secondary | ICD-10-CM | POA: Diagnosis not present

## 2020-04-23 DIAGNOSIS — Z03818 Encounter for observation for suspected exposure to other biological agents ruled out: Secondary | ICD-10-CM | POA: Diagnosis not present

## 2020-04-23 DIAGNOSIS — Z20822 Contact with and (suspected) exposure to covid-19: Secondary | ICD-10-CM | POA: Diagnosis not present

## 2020-08-21 ENCOUNTER — Ambulatory Visit (HOSPITAL_COMMUNITY)
Admission: EM | Admit: 2020-08-21 | Discharge: 2020-08-21 | Disposition: A | Discharge status: Home / Self Care | Payer: BC Managed Care – PPO | Attending: Urgent Care | Admitting: Urgent Care

## 2020-08-21 ENCOUNTER — Encounter (HOSPITAL_COMMUNITY): Payer: Self-pay

## 2020-08-21 ENCOUNTER — Other Ambulatory Visit: Payer: Self-pay

## 2020-08-21 DIAGNOSIS — R079 Chest pain, unspecified: Secondary | ICD-10-CM | POA: Diagnosis not present

## 2020-08-21 DIAGNOSIS — R0989 Other specified symptoms and signs involving the circulatory and respiratory systems: Secondary | ICD-10-CM

## 2020-08-21 DIAGNOSIS — M62838 Other muscle spasm: Secondary | ICD-10-CM

## 2020-08-21 DIAGNOSIS — M542 Cervicalgia: Secondary | ICD-10-CM | POA: Diagnosis not present

## 2020-08-21 MED ORDER — CETIRIZINE HCL 10 MG PO TABS: 10.0000 mg | ORAL_TABLET | Freq: Every day | ORAL | 0 refills | Status: DC

## 2020-08-21 MED ORDER — NAPROXEN 500 MG PO TABS: 500.0000 mg | ORAL_TABLET | Freq: Two times a day (BID) | ORAL | 0 refills | Status: DC

## 2020-08-21 MED ORDER — TIZANIDINE HCL 4 MG PO TABS: 4.0000 mg | ORAL_TABLET | Freq: Three times a day (TID) | ORAL | 0 refills | Status: DC | PRN

## 2020-08-21 NOTE — ED Provider Notes (Signed)
[facility]   MRN: [id number] DOB: 07/16/1976  Subjective:   Casey [name] is a 44 y.o. female presenting for acute onset this morning of moderate to severe left-sided neck pain between the uppermost medial/left side of her chest extending into the neck up to her jawline.  She also had transient intermittent central chest pain yesterday.  She is concerned about her circulation.  The only other symptom she has is a runny nose.  Denies fever, sore throat, ear pain, shortness of breath, body aches.  Denies any rashes.  The only medication the patient takes is Adderall.   Allergies  Allergen Reactions   Shellfish Allergy Anaphylaxis    seizures    Past Medical History:  Diagnosis Date   History of kidney stones      Past Surgical History:  Procedure Laterality Date   DILATION AND CURETTAGE OF UTERUS     WISDOM TOOTH EXTRACTION      Family History  Problem Relation Age of Onset   Alcohol abuse Neg Hx    Arthritis Neg Hx    Asthma Neg Hx    Birth defects Neg Hx    Cancer Neg Hx    COPD Neg Hx    Depression Neg Hx    Diabetes Neg Hx    Drug abuse Neg Hx    Early death Neg Hx    Hearing loss Neg Hx    Heart disease Neg Hx    Hyperlipidemia Neg Hx    Hypertension Neg Hx    Kidney disease Neg Hx    Learning disabilities Neg Hx    Mental illness Neg Hx    Mental retardation Neg Hx    Miscarriages / Stillbirths Neg Hx    Stroke Neg Hx    Vision loss Neg Hx    Varicose Veins Neg Hx     Social History   Tobacco Use   Smoking status: Never Smoker   Smokeless tobacco: Never Used  Substance Use Topics   Alcohol use: No   Drug use: No    ROS   Objective:   Vitals: BP (!) 143/89 (BP Location: Right Arm)    Pulse 85    Temp 97.6 F (36.4 C) (Oral)    Resp 20    SpO2 100%   Physical Exam Constitutional:      General: She is not in acute distress.    Appearance: Normal appearance. She is well-developed. She is  not ill-appearing, toxic-appearing or diaphoretic.  HENT:     Head: Normocephalic and atraumatic.     Right Ear: Tympanic membrane and ear canal normal. No drainage or tenderness. No middle ear effusion. Tympanic membrane is not erythematous.     Left Ear: Tympanic membrane and ear canal normal. No drainage or tenderness.  No middle ear effusion. Tympanic membrane is not erythematous.     Nose: Rhinorrhea present. No congestion.     Mouth/Throat:     Mouth: Mucous membranes are moist. No oral lesions.     Pharynx: Oropharynx is clear. No pharyngeal swelling, oropharyngeal exudate, posterior oropharyngeal erythema or uvula swelling.     Tonsils: No tonsillar exudate or tonsillar abscesses.  Eyes:     Extraocular Movements: Extraocular movements intact.     Right eye: Normal extraocular motion.     Left eye: Normal extraocular motion.     Conjunctiva/sclera: Conjunctivae normal.     Pupils: Pupils are equal, round, and reactive to light.  Neck:  Vascular: No carotid bruit.  Cardiovascular:     Rate and Rhythm: Normal rate and regular rhythm.     Pulses: Normal pulses.     Heart sounds: Normal heart sounds. No murmur heard.  No friction rub. No gallop.   Pulmonary:     Effort: Pulmonary effort is normal. No respiratory distress.     Breath sounds: Normal breath sounds. No stridor. No wheezing, rhonchi or rales.  Musculoskeletal:     Cervical back: Normal range of motion and neck supple. Tenderness (left side) present. No rigidity.  Lymphadenopathy:     Cervical: No cervical adenopathy.  Skin:    General: Skin is warm and dry.     Findings: No rash.  Neurological:     General: No focal deficit present.     Mental Status: She is alert and oriented to person, place, and time.  Psychiatric:        Mood and Affect: Mood normal.        Behavior: Behavior normal.        Thought Content: Thought content normal.     ED ECG REPORT   Date: 08/21/2020  Rate: 81bpm  Rhythm: normal  sinus rhythm  QRS Axis: normal  Intervals: normal  ST/T Wave abnormalities: normal  Conduction Disutrbances:none  Narrative Interpretation: Sinus rhythm at 81 bpm, no acute findings.  Old EKG Reviewed: None available.  I have personally reviewed the EKG tracing and agree with the computerized printout as noted.  Assessment and Plan :   PDMP not reviewed this encounter.  1. Neck pain   2. Muscle spasms of neck   3. Runny nose     Patient has normal cardiopulmonary exam, ENT exam.  I suspect she has musculoskeletal pain of the left side of her neck.  Recommended conservative management including tizanidine, naproxen.  Use Zyrtec for runny nose.  Offered a COVID-19 test which she declined.  Patient has low risk factors for blood clots and reassured her on this. Counseled patient on potential for adverse effects with medications prescribed/recommended today, ER and return-to-clinic precautions discussed, patient verbalized understanding.    Jaynee Eagles, [place] 08/21/20 [phone]

## 2020-08-21 NOTE — ED Triage Notes (Signed)
Pt c/o left neck pain/burning onset approx 30 minutes PTA. Denies recent exertion/strain. Able to articulate head side-to-side. Reports central CP of 'burning" nature yesterday, now resolved. Pt does state she drank 12 oz Red Bull yesterday.  Denies SOB, nausea, diaphoresis, changes in vision, dizziness, palpitations. Pt states anxiety at neck pain.  No carotid bruits auscultated, lungs CTA. No meds for symptoms PTA. EKG performed and given to M. Freida Busman, no new orders received.\

## 2021-05-01 DIAGNOSIS — E785 Hyperlipidemia, unspecified: Secondary | ICD-10-CM | POA: Diagnosis not present

## 2021-05-03 DIAGNOSIS — G43909 Migraine, unspecified, not intractable, without status migrainosus: Secondary | ICD-10-CM | POA: Diagnosis not present

## 2021-05-03 DIAGNOSIS — Z1331 Encounter for screening for depression: Secondary | ICD-10-CM | POA: Diagnosis not present

## 2021-05-03 DIAGNOSIS — Z0000 Encounter for general adult medical examination without abnormal findings: Secondary | ICD-10-CM | POA: Diagnosis not present

## 2021-05-03 DIAGNOSIS — R82998 Other abnormal findings in urine: Secondary | ICD-10-CM | POA: Diagnosis not present

## 2021-05-21 DIAGNOSIS — Z6824 Body mass index (BMI) 24.0-24.9, adult: Secondary | ICD-10-CM | POA: Diagnosis not present

## 2021-05-21 DIAGNOSIS — Z01419 Encounter for gynecological examination (general) (routine) without abnormal findings: Secondary | ICD-10-CM | POA: Diagnosis not present

## 2021-05-21 DIAGNOSIS — Z1231 Encounter for screening mammogram for malignant neoplasm of breast: Secondary | ICD-10-CM | POA: Diagnosis not present

## 2021-11-18 DIAGNOSIS — M25541 Pain in joints of right hand: Secondary | ICD-10-CM | POA: Diagnosis not present

## 2021-12-04 DIAGNOSIS — J0100 Acute maxillary sinusitis, unspecified: Secondary | ICD-10-CM | POA: Diagnosis not present

## 2021-12-04 DIAGNOSIS — J029 Acute pharyngitis, unspecified: Secondary | ICD-10-CM | POA: Diagnosis not present

## 2021-12-04 DIAGNOSIS — Z20822 Contact with and (suspected) exposure to covid-19: Secondary | ICD-10-CM | POA: Diagnosis not present

## 2022-05-22 DIAGNOSIS — E785 Hyperlipidemia, unspecified: Secondary | ICD-10-CM | POA: Diagnosis not present

## 2022-05-29 DIAGNOSIS — R82998 Other abnormal findings in urine: Secondary | ICD-10-CM | POA: Diagnosis not present

## 2022-06-02 DIAGNOSIS — E785 Hyperlipidemia, unspecified: Secondary | ICD-10-CM | POA: Diagnosis not present

## 2022-06-02 DIAGNOSIS — Z1339 Encounter for screening examination for other mental health and behavioral disorders: Secondary | ICD-10-CM | POA: Diagnosis not present

## 2022-06-02 DIAGNOSIS — Z0000 Encounter for general adult medical examination without abnormal findings: Secondary | ICD-10-CM | POA: Diagnosis not present

## 2022-06-02 DIAGNOSIS — Z1331 Encounter for screening for depression: Secondary | ICD-10-CM | POA: Diagnosis not present

## 2022-06-18 ENCOUNTER — Telehealth: Payer: Self-pay | Admitting: Internal Medicine

## 2022-06-18 NOTE — Telephone Encounter (Signed)
Good Afternoon Dr.Pyrtle,  We received records on this patient from [facility] [place], Mr. Earlean Shawl. Patient is requesting her care to you since Dr. Earlean Shawl has retired. She has family history of colon polyps.  Please review records and advise on scheduling. Thank you.

## 2022-06-25 ENCOUNTER — Encounter: Payer: Self-pay | Admitting: Internal Medicine

## 2022-07-11 ENCOUNTER — Encounter: Payer: Self-pay | Admitting: Internal Medicine

## 2022-07-11 ENCOUNTER — Ambulatory Visit (AMBULATORY_SURGERY_CENTER): Payer: Self-pay | Admitting: *Deleted

## 2022-07-11 VITALS — Ht 68.0 in | Wt 162.2 lb

## 2022-07-11 DIAGNOSIS — Z1211 Encounter for screening for malignant neoplasm of colon: Secondary | ICD-10-CM

## 2022-07-11 MED ORDER — NA SULFATE-K SULFATE-MG SULF 17.5-3.13-1.6 GM/177ML PO SOLN: 1.0000 | Freq: Once | ORAL | 0 refills | Status: AC

## 2022-07-11 NOTE — Progress Notes (Signed)
No egg or soy allergy known to patient  No issues known to pt with past sedation with any surgeries or procedures Patient denies ever being told they had issues or difficulty with intubation  No FH of Malignant Hyperthermia Pt is not on diet pills Pt is not on  home 02  Pt is not on blood thinners  Pt denies issues with constipation  No A fib or A flutter Have any cardiac testing pending--no Pt instructed to use Singlecare.[email or web address] or GoodRx for a price reduction on prep   

## 2022-07-24 ENCOUNTER — Encounter: Payer: Self-pay | Admitting: Internal Medicine

## 2022-07-24 ENCOUNTER — Ambulatory Visit (AMBULATORY_SURGERY_CENTER): Payer: BC Managed Care – PPO | Admitting: Internal Medicine

## 2022-07-24 VITALS — BP 106/74 | HR 109 | Temp 97.1°F | Resp 24 | Ht 68.0 in | Wt 162.2 lb

## 2022-07-24 DIAGNOSIS — K635 Polyp of colon: Secondary | ICD-10-CM

## 2022-07-24 DIAGNOSIS — Z1211 Encounter for screening for malignant neoplasm of colon: Secondary | ICD-10-CM

## 2022-07-24 DIAGNOSIS — Z83719 Family history of colon polyps, unspecified: Secondary | ICD-10-CM

## 2022-07-24 DIAGNOSIS — D124 Benign neoplasm of descending colon: Secondary | ICD-10-CM

## 2022-07-24 DIAGNOSIS — D122 Benign neoplasm of ascending colon: Secondary | ICD-10-CM

## 2022-07-24 MED ORDER — SODIUM CHLORIDE 0.9 % IV SOLN: 500.0000 mL | INTRAVENOUS | Status: AC

## 2022-07-24 NOTE — Progress Notes (Signed)
GASTROENTEROLOGY PROCEDURE H&P NOTE   Primary Care Physician: Patient, No Pcp Per    Reason for Procedure:  Family history of colon polyps/colon cancer screening  Plan:    colonoscopy  Patient is appropriate for endoscopic procedure(s) in the ambulatory ([facility]) setting.  The nature of the procedure, as well as the risks, benefits, and alternatives were carefully and thoroughly reviewed with the patient. Ample time for discussion and questions allowed. The patient understood, was satisfied, and agreed to proceed.     HPI: Casey [name] is a 46 y.o. female who presents for colonoscopy.  Medical history as below.  Tolerated the prep.  No recent chest pain or shortness of breath.  No abdominal pain today.  Past Medical History:  Diagnosis Date   Anxiety    History of kidney stones     Past Surgical History:  Procedure Laterality Date   DILATION AND CURETTAGE OF UTERUS     x 2   WISDOM TOOTH EXTRACTION      Prior to Admission medications   Medication Sig Start Date End Date Taking? Authorizing Provider  ADDERALL XR 15 MG 24 hr capsule Take by mouth daily. 08/08/20  Yes [provider]  calcium carbonate (TUMS - DOSED IN MG ELEMENTAL CALCIUM) 500 MG chewable tablet Chew 2 tablets by mouth 2 (two) times daily as needed for indigestion or heartburn. Patient not taking: Reported on 07/11/2022    [provider]    Current Outpatient Medications  Medication Sig Dispense Refill   ADDERALL XR 15 MG 24 hr capsule Take by mouth daily.     calcium carbonate (TUMS - DOSED IN MG ELEMENTAL CALCIUM) 500 MG chewable tablet Chew 2 tablets by mouth 2 (two) times daily as needed for indigestion or heartburn. (Patient not taking: Reported on 07/11/2022)     Current Facility-Administered Medications  Medication Dose Route Frequency Provider Last Rate Last Admin   0.9 %  sodium chloride infusion  500 mL Intravenous Continuous Jael Kostick, Lajuan Lines, MD        Allergies as of  07/24/2022 - Review Complete 07/11/2022  Allergen Reaction Noted   Shellfish allergy Anaphylaxis 09/09/2013    Family History  Problem Relation Age of Onset   Healthy Mother    Colon polyps Father    Healthy Father    Colon polyps Sister    Colon polyps Brother    Alcohol abuse Neg Hx    Arthritis Neg Hx    Asthma Neg Hx    Birth defects Neg Hx    Cancer Neg Hx    COPD Neg Hx    Depression Neg Hx    Diabetes Neg Hx    Drug abuse Neg Hx    Early death Neg Hx    Hearing loss Neg Hx    Heart disease Neg Hx    Hyperlipidemia Neg Hx    Hypertension Neg Hx    Kidney disease Neg Hx    Learning disabilities Neg Hx    Mental illness Neg Hx    Mental retardation Neg Hx    Miscarriages / Stillbirths Neg Hx    Stroke Neg Hx    Vision loss Neg Hx    Varicose Veins Neg Hx    Colon cancer Neg Hx    Crohn's disease Neg Hx    Esophageal cancer Neg Hx    Stomach cancer Neg Hx    Rectal cancer Neg Hx    Ulcerative colitis Neg Hx  Social History   Socioeconomic History   Marital status: Married    Spouse name: Not on file   Number of children: Not on file   Years of education: Not on file   Highest education level: Not on file  Occupational History   Not on file  Tobacco Use   Smoking status: Never    Passive exposure: Never   Smokeless tobacco: Never  Vaping Use   Vaping Use: Never used  Substance and Sexual Activity   Alcohol use: Yes    Comment: rarely   Drug use: No   Sexual activity: Yes    Birth control/protection: None  Other Topics Concern   Not on file  Social History Narrative   Not on file   Social Determinants of Health   Financial Resource Strain: Not on file  Food Insecurity: Not on file  Transportation Needs: Not on file  Physical Activity: Not on file  Stress: Not on file  Social Connections: Not on file  Intimate Partner Violence: Not on file    Physical Exam: Vital signs in last 24 hours: '@BP'$  120/65   Pulse (!) 109   Temp (!) 97.1  F (36.2 C)   Ht '5\' 8"'$  (1.727 m)   Wt 162 lb 3.2 oz (73.6 kg)   LMP 07/20/2022   SpO2 95%   BMI 24.66 kg/m  GEN: NAD EYE: Sclerae anicteric ENT: MMM CV: Non-tachycardic Pulm: CTA b/l GI: Soft, NT/ND NEURO:  Alert & Oriented x 3   Zenovia Jarred, MD [facility]  07/24/2022 4:28 PM

## 2022-07-24 NOTE — Progress Notes (Signed)
Sedate, gd SR, tolerated procedure well, VSS, report to RN 

## 2022-07-24 NOTE — Patient Instructions (Signed)
Thank you for letting us take care of your healthcare needs today. Please see handouts given to you on Polyps.    YOU HAD AN ENDOSCOPIC PROCEDURE TODAY AT THE [facility]:   Refer to the procedure report that was given to you for any specific questions about what was found during the examination.  If the procedure report does not answer your questions, please call your gastroenterologist to clarify.  If you requested that your care partner not be given the details of your procedure findings, then the procedure report has been included in a sealed envelope for you to review at your convenience later.  YOU SHOULD EXPECT: Some feelings of bloating in the abdomen. Passage of more gas than usual.  Walking can help get rid of the air that was put into your GI tract during the procedure and reduce the bloating. If you had a lower endoscopy (such as a colonoscopy or flexible sigmoidoscopy) you may notice spotting of blood in your stool or on the toilet paper. If you underwent a bowel prep for your procedure, you may not have a normal bowel movement for a few days.  Please Note:  You might notice some irritation and congestion in your nose or some drainage.  This is from the oxygen used during your procedure.  There is no need for concern and it should clear up in a day or so.  SYMPTOMS TO REPORT IMMEDIATELY:  Following lower endoscopy (colonoscopy or flexible sigmoidoscopy):  Excessive amounts of blood in the stool  Significant tenderness or worsening of abdominal pains  Swelling of the abdomen that is new, acute  Fever of 100F or higher   For urgent or emergent issues, a gastroenterologist can be reached at any hour by calling [phone]. Do not use MyChart messaging for urgent concerns.    DIET:  We do recommend a small meal at first, but then you may proceed to your regular diet.  Drink plenty of fluids but you should avoid alcoholic beverages for 24 hours.  ACTIVITY:  You  should plan to take it easy for the rest of today and you should NOT DRIVE or use heavy machinery until tomorrow (because of the sedation medicines used during the test).    FOLLOW UP: Our staff will call the number listed on your records the next business day following your procedure.  We will call around 7:15- 8:00 am to check on you and address any questions or concerns that you may have regarding the information given to you following your procedure. If we do not reach you, we will leave a message.     If any biopsies were taken you will be contacted by phone or by letter within the next 1-3 weeks.  Please call us at [phone] if you have not heard about the biopsies in 3 weeks.    SIGNATURES/CONFIDENTIALITY: You and/or your care partner have signed paperwork which will be entered into your electronic medical record.  These signatures attest to the fact that that the information above on your After Visit Summary has been reviewed and is understood.  Full responsibility of the confidentiality of this discharge information lies with you and/or your care-partner. 

## 2022-07-24 NOTE — Op Note (Signed)
[facility] Patient Name: Casey [name] Procedure Date: 07/24/2022 4:28 PM MRN: [id number] Endoscopist: Jerene Bears , MD Age: 46 Referring MD:  Date of Birth: 08-30-1976 Gender: Female Account #: [id number] Procedure:                Colonoscopy Indications:              Colon cancer screening in patient at increased                            risk: Family history of colon polyps in multiple                            1st-degree relatives, This is the patient's first                            colonoscopy Medicines:                Monitored Anesthesia Care Procedure:                Pre-Anesthesia Assessment:                           - Prior to the procedure, a History and Physical                            was performed, and patient medications and                            allergies were reviewed. The patient's tolerance of                            previous anesthesia was also reviewed. The risks                            and benefits of the procedure and the sedation                            options and risks were discussed with the patient.                            All questions were answered, and informed consent                            was obtained. Prior Anticoagulants: The patient has                            taken no previous anticoagulant or antiplatelet                            agents. ASA Grade Assessment: II - A patient with                            mild systemic disease. After reviewing the risks  and benefits, the patient was deemed in                            satisfactory condition to undergo the procedure.                           After obtaining informed consent, the colonoscope                            was passed under direct vision. Throughout the                            procedure, the patient's blood pressure, pulse, and                            oxygen saturations were monitored continuously. The                             Olympus Scope [id number] was introduced through the                            anus and advanced to the terminal ileum. The                            colonoscopy was performed without difficulty. The                            patient tolerated the procedure well. The quality                            of the bowel preparation was good. The terminal                            ileum, ileocecal valve, appendiceal orifice, and                            rectum were photographed. Scope In: 4:38:01 PM Scope Out: 4:56:45 PM Scope Withdrawal Time: 0 hours 16 minutes 8 seconds  Total Procedure Duration: 0 hours 18 minutes 44 seconds  Findings:                 The digital rectal exam was normal.                           The terminal ileum appeared normal.                           A 7 mm polyp was found in the ascending colon. The                            polyp was sessile. The polyp was removed with a                            cold snare. Resection and retrieval were complete.  A 5 mm polyp was found in the descending colon. The                            polyp was sessile. The polyp was removed with a                            cold snare. Resection and retrieval were complete.                           A 5 mm polyp was found in the sigmoid colon. The                            polyp was sessile. The polyp was removed with a                            cold snare. Resection and retrieval were complete.                           The exam was otherwise without abnormality on                            direct and retroflexion views. Complications:            No immediate complications. Estimated Blood Loss:     Estimated blood loss: none. Impression:               - The examined portion of the ileum was normal.                           - One 7 mm polyp in the ascending colon, removed                            with a cold snare. Resected and  retrieved.                           - One 5 mm polyp in the descending colon, removed                            with a cold snare. Resected and retrieved.                           - One 5 mm polyp in the sigmoid colon, removed with                            a cold snare. Resected and retrieved.                           - The examination was otherwise normal on direct                            and retroflexion views. Recommendation:           - Patient has a contact number available for  emergencies. The signs and symptoms of potential                            delayed complications were discussed with the                            patient. Return to normal activities tomorrow.                            Written discharge instructions were provided to the                            patient.                           - Resume previous diet.                           - Continue present medications.                           - Await pathology results.                           - Repeat colonoscopy is recommended. The                            colonoscopy date will be determined after pathology                            results from today's exam become available for                            review. Jerene Bears, MD 07/24/2022 5:00:09 PM This report has been signed electronically.

## 2022-07-24 NOTE — Progress Notes (Signed)
Called to room to assist during endoscopic procedure.  Patient ID and intended procedure confirmed with present staff. Received instructions for my participation in the procedure from the performing physician.  

## 2022-07-25 ENCOUNTER — Telehealth: Payer: Self-pay

## 2022-07-25 NOTE — Telephone Encounter (Signed)
  Follow up Call-     07/24/2022    3:20 PM  Call back number  Post procedure Call Back phone  # [phone]  Permission to leave phone message Yes   Follow up call, LVM

## 2022-07-31 ENCOUNTER — Encounter: Payer: Self-pay | Admitting: Internal Medicine

## 2022-10-29 DIAGNOSIS — J101 Influenza due to other identified influenza virus with other respiratory manifestations: Secondary | ICD-10-CM | POA: Diagnosis not present

## 2022-10-29 DIAGNOSIS — R0981 Nasal congestion: Secondary | ICD-10-CM | POA: Diagnosis not present

## 2023-03-23 DIAGNOSIS — Z01419 Encounter for gynecological examination (general) (routine) without abnormal findings: Secondary | ICD-10-CM | POA: Diagnosis not present

## 2023-03-23 DIAGNOSIS — Z1231 Encounter for screening mammogram for malignant neoplasm of breast: Secondary | ICD-10-CM | POA: Diagnosis not present

## 2023-03-23 DIAGNOSIS — Z6822 Body mass index (BMI) 22.0-22.9, adult: Secondary | ICD-10-CM | POA: Diagnosis not present

## 2023-03-26 ENCOUNTER — Other Ambulatory Visit: Payer: Self-pay | Admitting: Obstetrics and Gynecology

## 2023-03-26 DIAGNOSIS — R928 Other abnormal and inconclusive findings on diagnostic imaging of breast: Secondary | ICD-10-CM

## 2023-04-03 ENCOUNTER — Other Ambulatory Visit: Payer: Self-pay | Admitting: Obstetrics and Gynecology

## 2023-04-03 ENCOUNTER — Ambulatory Visit
Admission: RE | Admit: 2023-04-03 | Discharge: 2023-04-03 | Disposition: A | Discharge status: Home / Self Care | Payer: BC Managed Care – PPO | Source: Ambulatory Visit | Attending: Obstetrics and Gynecology | Admitting: Obstetrics and Gynecology

## 2023-04-03 DIAGNOSIS — N6315 Unspecified lump in the right breast, overlapping quadrants: Secondary | ICD-10-CM | POA: Diagnosis not present

## 2023-04-03 DIAGNOSIS — R928 Other abnormal and inconclusive findings on diagnostic imaging of breast: Secondary | ICD-10-CM

## 2023-04-06 ENCOUNTER — Ambulatory Visit
Admission: RE | Admit: 2023-04-06 | Discharge: 2023-04-06 | Disposition: A | Discharge status: Home / Self Care | Payer: BC Managed Care – PPO | Source: Ambulatory Visit | Attending: Obstetrics and Gynecology | Admitting: Obstetrics and Gynecology

## 2023-04-06 DIAGNOSIS — R928 Other abnormal and inconclusive findings on diagnostic imaging of breast: Secondary | ICD-10-CM

## 2023-04-06 DIAGNOSIS — C50811 Malignant neoplasm of overlapping sites of right female breast: Secondary | ICD-10-CM | POA: Diagnosis not present

## 2023-04-06 DIAGNOSIS — N6315 Unspecified lump in the right breast, overlapping quadrants: Secondary | ICD-10-CM | POA: Diagnosis not present

## 2023-04-06 DIAGNOSIS — Z170 Estrogen receptor positive status [ER+]: Secondary | ICD-10-CM | POA: Diagnosis not present

## 2023-04-06 HISTORY — PX: BREAST BIOPSY: SHX20

## 2023-04-10 ENCOUNTER — Telehealth: Payer: Self-pay | Admitting: *Deleted

## 2023-04-10 ENCOUNTER — Telehealth: Payer: Self-pay | Admitting: Hematology and Oncology

## 2023-04-10 NOTE — Telephone Encounter (Signed)
Spoke to patient to confirm upcoming morning [facility] clinic appointment on 7/10, paperwork will be sent via e-mail.   Gave location and time, also informed patient that the surgeon's office would be calling as well to get information from them similar to the packet that they will be receiving so make sure to do both.  Reminded patient that all providers will be coming to the clinic to see them HERE and if they had any questions to not hesitate to reach back out to myself or their navigators.

## 2023-04-10 NOTE — Telephone Encounter (Signed)
LVM for patient to return my call in reference to upcoming [facility] appointment on 7/10

## 2023-04-13 ENCOUNTER — Encounter: Payer: Self-pay | Admitting: *Deleted

## 2023-04-13 DIAGNOSIS — Z170 Estrogen receptor positive status [ER+]: Secondary | ICD-10-CM

## 2023-04-14 NOTE — Progress Notes (Signed)
Radiation Oncology         [phone]) [id number] ________________________________  Name: Casey [name]        MRN: [id number]  Date of Service: 04/15/2023 DOB: 1976/07/10  WJ:[id number], No Pcp Per  Almond Lint, MD     REFERRING PHYSICIAN: Almond Lint, MD   DIAGNOSIS: There were no encounter diagnoses.   HISTORY OF PRESENT ILLNESS: Casey [name] is a 47 y.o. female seen in the multidisciplinary breast clinic for a new diagnosis of right breast cancer. The patient was noted to have an abnormality on screening mammogram. She proceeded with a diagnostic mammogram and ultrasound that showed a 1.2 cm spiculated mass in the 12 o'clock position of the right 2 cmfn. Accordingly, patient underwent biopsy of the right breast mass on 04/06/2023. Pathology revealed intermediate grade invasive ductal carcinoma. Prognostic indicators showed estrogen and progesterone receptors 99% positive with strong staining intensity, Her2 positive, and a Ki67 of 35%.   She is seen today to discuss treatment recommendations of her cancer.      PREVIOUS RADIATION THERAPY: {EXAM; YES/NO:19492::"No"}   PAST MEDICAL HISTORY:  Past Medical History:  Diagnosis Date   Anxiety    History of kidney stones        PAST SURGICAL HISTORY: Past Surgical History:  Procedure Laterality Date   BREAST BIOPSY Right 04/06/2023   BREAST BIOPSY Right 04/06/2023   [place] RT BREAST BX W LOC DEV 1ST LESION IMG BX SPEC US GUIDE 04/06/2023 [facility]   DILATION AND CURETTAGE OF UTERUS     x 2   WISDOM TOOTH EXTRACTION       FAMILY HISTORY:  Family History  Problem Relation Age of Onset   Healthy Mother    Colon polyps Father    Healthy Father    Colon polyps Sister    Colon polyps Brother    Alcohol abuse Neg Hx    Arthritis Neg Hx    Asthma Neg Hx    Birth defects Neg Hx    Cancer Neg Hx    COPD Neg Hx    Depression Neg Hx    Diabetes Neg Hx    Drug abuse Neg Hx    Early death Neg Hx    Hearing loss Neg Hx     Heart disease Neg Hx    Hyperlipidemia Neg Hx    Hypertension Neg Hx    Kidney disease Neg Hx    Learning disabilities Neg Hx    Mental illness Neg Hx    Mental retardation Neg Hx    Miscarriages / Stillbirths Neg Hx    Stroke Neg Hx    Vision loss Neg Hx    Varicose Veins Neg Hx    Colon cancer Neg Hx    Crohn's disease Neg Hx    Esophageal cancer Neg Hx    Stomach cancer Neg Hx    Rectal cancer Neg Hx    Ulcerative colitis Neg Hx      SOCIAL HISTORY:  reports that she has never smoked. She has never been exposed to tobacco smoke. She has never used smokeless tobacco. She reports current alcohol use. She reports that she does not use drugs.   ALLERGIES: Shellfish allergy   MEDICATIONS:  Current Outpatient Medications  Medication Sig Dispense Refill   ADDERALL XR 15 MG 24 hr capsule Take by mouth daily.     calcium carbonate (TUMS - DOSED IN MG ELEMENTAL CALCIUM) 500 MG chewable tablet Chew 2 tablets by mouth 2 (  two) times daily as needed for indigestion or heartburn. (Patient not taking: Reported on 07/11/2022)     Current Facility-Administered Medications  Medication Dose Route Frequency Provider Last Rate Last Admin   0.9 %  sodium chloride infusion  500 mL Intravenous Continuous Pyrtle, Carie Caddy, MD         REVIEW OF SYSTEMS: On review of systems, the patient reports that she is doing well overall. No breast specific complaints are verbalized.  ***      PHYSICAL EXAM:  Wt Readings from Last 3 Encounters:  07/24/22 162 lb 3.2 oz (73.6 kg)  07/11/22 162 lb 3.2 oz (73.6 kg)  10/03/13 179 lb (81.2 kg)   Temp Readings from Last 3 Encounters:  07/24/22 (!) 97.1 F (36.2 C)  08/21/20 97.6 F (36.4 C) (Oral)  10/06/13 97.3 F (36.3 C) (Oral)   BP Readings from Last 3 Encounters:  07/24/22 106/74  08/21/20 (!) 143/89  10/06/13 115/77   Pulse Readings from Last 3 Encounters:  07/24/22 (!) 109  08/21/20 85  10/06/13 77    In general this is a well appearing ***  female in no acute distress. She's alert and oriented x4 and appropriate throughout the examination. Cardiopulmonary assessment is negative for acute distress and she exhibits normal effort. Bilateral breast exam is deferred.    ECOG = ***  0 - Asymptomatic (Fully active, able to carry on all predisease activities without restriction)  1 - Symptomatic but completely ambulatory (Restricted in physically strenuous activity but ambulatory and able to carry out work of a light or sedentary nature. For example, light housework, office work)  2 - Symptomatic, <50% in bed during the day (Ambulatory and capable of all self care but unable to carry out any work activities. Up and about more than 50% of waking hours)  3 - Symptomatic, >50% in bed, but not bedbound (Capable of only limited self-care, confined to bed or chair 50% or more of waking hours)  4 - Bedbound (Completely disabled. Cannot carry on any self-care. Totally confined to bed or chair)  5 - Death   Santiago Glad MM, Creech RH, Tormey DC, et al. [phone]). "Toxicity and response criteria of the [facility]". Am. Evlyn Clines. Oncol. 5 (6): 649-55    LABORATORY DATA:  Lab Results  Component Value Date   WBC 11.2 (H) 10/05/2013   HGB 10.6 (L) 10/05/2013   HCT 31.3 (L) 10/05/2013   MCV 94.3 10/05/2013   PLT 209 10/05/2013   Lab Results  Component Value Date   NA 138 09/16/2013   K 3.0 (L) 09/16/2013   CL 103 09/16/2013   CO2 25 09/16/2013   Lab Results  Component Value Date   ALT 7 09/16/2013   AST 15 09/16/2013   ALKPHOS 127 (H) 09/16/2013   BILITOT 0.2 (L) 09/16/2013      RADIOGRAPHY: [place] RT BREAST BX W LOC DEV 1ST LESION IMG BX SPEC US GUIDE  Addendum Date: 04/08/2023   ADDENDUM REPORT: 04/08/2023 13:56 ADDENDUM: Pathology revealed GRADE II INVASIVE DUCTAL CARCINOMA, OTHER FINDINGS: NONE of the RIGHT breast, 12 o'clock, 2 cmfn, (coil clip). This was found to be concordant by Dr. Emmaline Kluver. Pathology  results were discussed with the patient by telephone. The patient reported doing well after the biopsy with minimal tenderness at the site. Post biopsy instructions and care were reviewed and questions were answered. The patient was encouraged to call [facility] for any additional concerns. My  direct phone number was provided. The patient was referred to [facility] on April 15, 2023. Pathology results reported by Rene Kocher, RN on 04/08/2023. Electronically Signed   By: Emmaline Kluver M.D.   On: 04/08/2023 13:56   Result Date: 04/08/2023 CLINICAL DATA:  47 year old female presenting for biopsy of a mass in the right breast. EXAM: ULTRASOUND GUIDED RIGHT BREAST CORE NEEDLE BIOPSY COMPARISON:  Previous exam(s). PROCEDURE: I met with the patient and we discussed the procedure of ultrasound-guided biopsy, including benefits and alternatives. We discussed the high likelihood of a successful procedure. We discussed the risks of the procedure, including infection, bleeding, tissue injury, clip migration, and inadequate sampling. Informed written consent was given. The usual time-out protocol was performed immediately prior to the procedure. Lesion quadrant: Upper inner quadrant Using sterile technique and 1% Lidocaine as local anesthetic, under direct ultrasound visualization, a 12 gauge spring-loaded device was used to perform biopsy of a mass in the right breast 12 o'clock using a lateral approach. At the conclusion of the procedure coil shaped tissue marker clip was deployed into the biopsy cavity. Follow up 2 view mammogram was performed and dictated separately. IMPRESSION: Ultrasound guided biopsy of a mass in the right breast at 12 o'clock. No apparent complications. Electronically Signed: By: Emmaline Kluver M.D. On: 04/06/2023 10:22  MM CLIP PLACEMENT RIGHT  Result Date: 04/06/2023 CLINICAL DATA:  Post  procedure mammogram for clip placement EXAM: 3D DIAGNOSTIC RIGHT MAMMOGRAM POST ULTRASOUND BIOPSY COMPARISON:  Previous exam(s). FINDINGS: 3D Mammographic images were obtained following ultrasound guided biopsy of a mass in the right breast at 12 o'clock. The coil biopsy marking clip is in expected position at the site of biopsy. IMPRESSION: Appropriate positioning of the coil shaped biopsy marking clip at the site of biopsy in the right breast at 12 o'clock. Final Assessment: Post Procedure Mammograms for Marker Placement Electronically Signed   By: Emmaline Kluver M.D.   On: 04/06/2023 10:20  MM 3D DIAGNOSTIC MAMMOGRAM UNILATERAL RIGHT BREAST  Result Date: 04/03/2023 CLINICAL DATA:  Right breast distortion seen on most recent screening mammography. EXAM: DIGITAL DIAGNOSTIC UNILATERAL RIGHT MAMMOGRAM WITH TOMOSYNTHESIS AND CAD; ULTRASOUND RIGHT BREAST LIMITED TECHNIQUE: Right digital diagnostic mammography and breast tomosynthesis was performed. The images were evaluated with computer-aided detection. ; Targeted ultrasound examination of the right breast was performed COMPARISON:  Previous exam(s). ACR Breast Density Category d: The breasts are extremely dense, which lowers the sensitivity of mammography. FINDINGS: Additional mammographic views of the right breast demonstrate persistent spiculated mass in the right upper slightly outer quadrant, anterior depth, measuring approximately 1 cm mammographically. There is an associated architectural distortion. Targeted right breast ultrasound is performed demonstrating 12 o'clock 2 cm from the nipple irregular hypoechoic spiculated mass measuring 0.9 x 1.1 x 1.2 cm. This finding corresponds to the mass seen mammographically. Evaluation of the right axilla demonstrates no evidence of lymphadenopathy. IMPRESSION: Right breast 12 o'clock suspicious mass, for which ultrasound-guided core needle biopsy is recommended. RECOMMENDATION: One site ultrasound-guided core  needle biopsy of the right breast. I have discussed the findings and recommendations with the patient. If applicable, a reminder letter will be sent to the patient regarding the next appointment. BI-RADS CATEGORY  5: Highly suggestive of malignancy. Electronically Signed   By: Ted Mcalpine M.D.   On: 04/03/2023 09:47  [place] LIMITED ULTRASOUND INCLUDING AXILLA RIGHT BREAST  Result Date: 04/03/2023 CLINICAL DATA:  Right breast distortion seen on most  recent screening mammography. EXAM: DIGITAL DIAGNOSTIC UNILATERAL RIGHT MAMMOGRAM WITH TOMOSYNTHESIS AND CAD; ULTRASOUND RIGHT BREAST LIMITED TECHNIQUE: Right digital diagnostic mammography and breast tomosynthesis was performed. The images were evaluated with computer-aided detection. ; Targeted ultrasound examination of the right breast was performed COMPARISON:  Previous exam(s). ACR Breast Density Category d: The breasts are extremely dense, which lowers the sensitivity of mammography. FINDINGS: Additional mammographic views of the right breast demonstrate persistent spiculated mass in the right upper slightly outer quadrant, anterior depth, measuring approximately 1 cm mammographically. There is an associated architectural distortion. Targeted right breast ultrasound is performed demonstrating 12 o'clock 2 cm from the nipple irregular hypoechoic spiculated mass measuring 0.9 x 1.1 x 1.2 cm. This finding corresponds to the mass seen mammographically. Evaluation of the right axilla demonstrates no evidence of lymphadenopathy. IMPRESSION: Right breast 12 o'clock suspicious mass, for which ultrasound-guided core needle biopsy is recommended. RECOMMENDATION: One site ultrasound-guided core needle biopsy of the right breast. I have discussed the findings and recommendations with the patient. If applicable, a reminder letter will be sent to the patient regarding the next appointment. BI-RADS CATEGORY  5: Highly suggestive of malignancy. Electronically Signed   By:  Ted Mcalpine M.D.   On: 04/03/2023 09:47      IMPRESSION/PLAN: 1. *** Dr. Mitzi Hansen discussed the pathology findings and reviewed the nature of ***. The consensus from the breast conference includes ***. Dr. Mitzi Hansen recommends external beam radiotherapy to the breast following her ***lumpectomy to reduce risks of local recurrence followed by antiestrogen therapy. We discussed the risks, benefits, short, and long term effects of radiotherapy, as well as the *** intent, and the patient is interested in proceeding. Dr. Mitzi Hansen discussed the delivery and logistics of radiotherapy and anticipates a course of *** weeks of radiotherapy to the *** breast with *** deep inspiration breath hold technique. We will see her back a few weeks after surgery to discuss the simulation process and anticipate starting radiotherapy about 4-6 weeks after surgery.   2. Possible genetic predisposition to malignancy. The patient is a candidate for genetic testing given *** personal and family history. She will meet with our geneticist today in clinic.   In a visit lasting *** minutes, greater than 50% of the time was spent face to face reviewing her case, as well as in preparation of, discussing, and coordinating the patient's care.  The above documentation reflects my direct findings during this shared patient visit. Please see the separate note by Dr. Mitzi Hansen on this date for the remainder of the patient's plan of care.    Joyice Faster, [place]    **Disclaimer: This note was dictated with voice recognition software. Similar sounding words can inadvertently be transcribed and this note may contain transcription errors which may not have been corrected upon publication of note.**

## 2023-04-15 ENCOUNTER — Inpatient Hospital Stay: Payer: BC Managed Care – PPO | Admitting: Licensed Clinical Social Worker

## 2023-04-15 ENCOUNTER — Other Ambulatory Visit: Payer: Self-pay | Admitting: *Deleted

## 2023-04-15 ENCOUNTER — Other Ambulatory Visit: Payer: Self-pay

## 2023-04-15 ENCOUNTER — Encounter: Payer: Self-pay | Admitting: Genetic Counselor

## 2023-04-15 ENCOUNTER — Inpatient Hospital Stay (HOSPITAL_BASED_OUTPATIENT_CLINIC_OR_DEPARTMENT_OTHER): Payer: BC Managed Care – PPO | Admitting: Genetic Counselor

## 2023-04-15 ENCOUNTER — Inpatient Hospital Stay: Payer: BC Managed Care – PPO | Attending: Hematology and Oncology | Admitting: Hematology and Oncology

## 2023-04-15 ENCOUNTER — Encounter: Payer: Self-pay | Admitting: *Deleted

## 2023-04-15 ENCOUNTER — Ambulatory Visit: Payer: BC Managed Care – PPO | Attending: General Surgery | Admitting: Physical Therapy

## 2023-04-15 ENCOUNTER — Encounter: Payer: Self-pay | Admitting: Physical Therapy

## 2023-04-15 ENCOUNTER — Other Ambulatory Visit: Payer: Self-pay | Admitting: General Surgery

## 2023-04-15 ENCOUNTER — Ambulatory Visit
Admission: RE | Admit: 2023-04-15 | Discharge: 2023-04-15 | Disposition: A | Discharge status: Home / Self Care | Payer: BC Managed Care – PPO | Source: Ambulatory Visit | Attending: Radiation Oncology | Admitting: Radiation Oncology

## 2023-04-15 ENCOUNTER — Inpatient Hospital Stay: Payer: BC Managed Care – PPO

## 2023-04-15 VITALS — BP 112/84 | HR 87 | Temp 97.5°F | Resp 18 | Wt 143.2 lb

## 2023-04-15 DIAGNOSIS — Z170 Estrogen receptor positive status [ER+]: Secondary | ICD-10-CM

## 2023-04-15 DIAGNOSIS — Z8042 Family history of malignant neoplasm of prostate: Secondary | ICD-10-CM | POA: Diagnosis not present

## 2023-04-15 DIAGNOSIS — Z83719 Family history of colon polyps, unspecified: Secondary | ICD-10-CM | POA: Diagnosis not present

## 2023-04-15 DIAGNOSIS — C50411 Malignant neoplasm of upper-outer quadrant of right female breast: Secondary | ICD-10-CM

## 2023-04-15 DIAGNOSIS — R293 Abnormal posture: Secondary | ICD-10-CM | POA: Insufficient documentation

## 2023-04-15 LAB — CMP (CANCER CENTER ONLY)
ALT: 13 U/L (ref 0–44)
AST: 16 U/L (ref 15–41)
Albumin: 3.9 g/dL (ref 3.5–5.0)
Alkaline Phosphatase: 49 U/L (ref 38–126)
Anion gap: 4 — ABNORMAL LOW (ref 5–15)
BUN: 10 mg/dL (ref 6–20)
CO2: 27 mmol/L (ref 22–32)
Calcium: 9 mg/dL (ref 8.9–10.3)
Chloride: 106 mmol/L (ref 98–111)
Creatinine: 0.85 mg/dL (ref 0.44–1.00)
GFR, Estimated: >60 mL/min (ref >60)
Glucose, Bld: 87 mg/dL (ref 70–99)
Potassium: 4 mmol/L (ref 3.5–5.1)
Sodium: 137 mmol/L (ref 135–145)
Total Bilirubin: 0.4 mg/dL (ref 0.3–1.2)
Total Protein: 6.9 g/dL (ref 6.5–8.1)

## 2023-04-15 LAB — CBC WITH DIFFERENTIAL (CANCER CENTER ONLY)
Abs Immature Granulocytes: 0 10*3/uL (ref 0.00–0.07)
Basophils Absolute: 0 10*3/uL (ref 0.0–0.1)
Basophils Relative: 1 %
Eosinophils Absolute: 0.2 10*3/uL (ref 0.0–0.5)
Eosinophils Relative: 5 %
HCT: 40.5 % (ref 36.0–46.0)
Hemoglobin: 13.9 g/dL (ref 12.0–15.0)
Immature Granulocytes: 0 %
Lymphocytes Relative: 51 %
Lymphs Abs: 2.1 10*3/uL (ref 0.7–4.0)
MCH: 32.6 pg (ref 26.0–34.0)
MCHC: 34.3 g/dL (ref 30.0–36.0)
MCV: 94.8 fL (ref 80.0–100.0)
Monocytes Absolute: 0.4 10*3/uL (ref 0.1–1.0)
Monocytes Relative: 11 %
Neutro Abs: 1.3 10*3/uL — ABNORMAL LOW (ref 1.7–7.7)
Neutrophils Relative %: 32 %
Platelet Count: 256 10*3/uL (ref 150–400)
RBC: 4.27 MIL/uL (ref 3.87–5.11)
RDW: 12.3 % (ref 11.5–15.5)
WBC Count: 4.1 10*3/uL (ref 4.0–10.5)
nRBC: 0 % (ref 0.0–0.2)

## 2023-04-15 LAB — GENETIC SCREENING ORDER

## 2023-04-15 MED ORDER — LORAZEPAM 0.5 MG PO TABS: ORAL_TABLET | ORAL | 0 refills | Status: AC

## 2023-04-15 NOTE — Research (Signed)
[employer] 2021-05 - Specimen Collection Study to Evaluate Biomarkers in Subjects with Cancer    Patient Casey [name] was identified by Dr. Al Pimple as a potential candidate for the above listed study.  This Clinical Research Coordinator met with Shyanna Klingel, [id number], on 04/15/23 in a manner and location that ensures patient privacy to discuss participation in the above listed research study.  Patient is Accompanied by her sister .  A copy of the informed consent document with embedded HIPAA language was provided to the patient.  Patient reads, speaks, and understands [place].   Patient was provided with the business card of this Coordinator and encouraged to contact the research team with any questions.  Approximately 15 minutes were spent with the patient reviewing the informed consent documents.  Patient was provided the option of taking informed consent documents home to review and was encouraged to review at their convenience with their support network, including other care providers. Patient took the consent documents home to review.  Will call the patient next week Wednesday afternoon, 04/22/2023, to confirm study interest.  Felecia Jan, [facility] 04/15/2023 4:06 PM

## 2023-04-15 NOTE — Assessment & Plan Note (Signed)
This is a very pleasant 47 year old premenopausal female patient with newly diagnosed right breast upper outer quadrant invasive ductal carcinoma, grade 2 ER/PR and HER2 positive diagnosed on screening mammogram presented with her sister for initial evaluation to our breast MDC.  Patient is healthy at baseline.  There is no family history of any cancer.  She denies any palpable masses before the mammogram.  She has had some recalls and ultrasounds given her dense breast but no abnormal biopsies.  Given her small tumor, tumor under 2 cm and node-negative status, we have discussed about upfront surgery followed by adjuvant chemotherapy given triple positive.  We have discussed about chemotherapy with doublet chemo which is carboplatin and docetaxel with Herceptin versus Taxol and Herceptin.  We have discussed briefly about adverse effects from chemotherapy including but not limited to fatigue, nausea, vomiting, diarrhea, cytopenias, immunocompromise status, increased risk of infections, neuropathy, cardiotoxicity etc.  She understands that some of the side effects can be permanent.  Post chemo, she will proceed with adjuvant radiation followed by antiestrogen therapy.  Once again given her premenopausal status, we have discussed about tamoxifen as her choice of antiestrogen therapy.  We have discussed about the mechanism of action of tamoxifen, adverse effects including but not limited to side effects such as hot flashes, mood swings, vaginal discharge, risk of DVT/PE, endometrial hyperplasia, endometrial carcinoma and benefit on bone density.  At this time she will return to clinic after surgery to discuss additional adjuvant recommendations.

## 2023-04-15 NOTE — Progress Notes (Signed)
[facility] Clinical Social Work  Initial Assessment   Casey [name] is a 47 y.o. year old female accompanied by sister. Clinical Social Work was referred by  [facility]  for assessment of psychosocial needs.   SDOH (Social Determinants of Health) assessments performed: Yes SDOH Interventions    Flowsheet Row Clinical Support from 04/15/2023 in [facility]  SDOH Interventions   Food Insecurity Interventions Intervention Not Indicated  Housing Interventions Intervention Not Indicated  Transportation Interventions Intervention Not Indicated  Utilities Interventions Intervention Not Indicated       SDOH Screenings   Food Insecurity: No Food Insecurity (04/15/2023)  Housing: Low Risk  (04/15/2023)  Transportation Needs: No Transportation Needs (04/15/2023)  Utilities: Not At Risk (04/15/2023)  Depression (PHQ2-9): Low Risk  (04/15/2023)  Tobacco Use: Low Risk  (04/15/2023)     Distress Screen completed: No     No data to display            Family/Social Information:  Housing Arrangement: patient lives with husband, two kids (15yo son, 9yo daughter) Family members/support persons in your life? Family (husband, sister, cousin) and Friends Transportation concerns: no  Employment: Working full time as independent Tax adviser. No benefits (FMLA, STD, etc)  Income source: Employment and husband's employment Financial concerns: No Type of concern: None Food access concerns: no Religious or spiritual practice: Not known Services Currently in place:  [employer]  Coping/ Adjustment to diagnosis: Patient understands treatment plan and what happens next? yes Concerns about diagnosis and/or treatment: I'm not especially worried about anything Patient enjoys watching TV Current coping skills/ strengths: Active sense of humor , Capable of independent living , Communication skills , Motivation for treatment/growth , and Supportive family/friends      SUMMARY: Current SDOH Barriers:  No major barriers noted today  Clinical Social Work Clinical Goal(s):  No clinical social work goals at this time  Interventions: Discussed common feeling and emotions when being diagnosed with cancer, and the importance of support during treatment Informed patient of the support team roles and support services at [facility] Provided CSW contact information and encouraged patient to call with any questions or concerns   Follow Up Plan: Patient will contact CSW with any support or resource needs Patient verbalizes understanding of plan: Yes    [name], LCSW Clinical Social Worker [facility]

## 2023-04-15 NOTE — Therapy (Signed)
OUTPATIENT PHYSICAL THERAPY BREAST CANCER BASELINE EVALUATION   Patient Name: Casey [name] MRN: [id number] DOB:10-Dec-1975, 47 y.o., female Today's Date: 04/15/2023  END OF SESSION:  PT End of Session - 04/15/23 0901     Visit Number 1    Number of Visits 2    Date for PT Re-Evaluation 06/10/23    PT Start Time 1130    PT Stop Time 1203    PT Time Calculation (min) 33 min    Activity Tolerance Patient tolerated treatment well    Behavior During Therapy [facility] for tasks assessed/performed             Past Medical History:  Diagnosis Date   Anxiety    History of kidney stones    Past Surgical History:  Procedure Laterality Date   BREAST BIOPSY Right 04/06/2023   BREAST BIOPSY Right 04/06/2023   [place] RT BREAST BX W LOC DEV 1ST LESION IMG BX SPEC US GUIDE 04/06/2023 [facility] MAMMOGRAPHY   DILATION AND CURETTAGE OF UTERUS     x 2   WISDOM TOOTH EXTRACTION     Patient Active Problem List   Diagnosis Date Noted   Malignant neoplasm of upper-outer quadrant of right breast in female, estrogen receptor positive (HCC) 04/13/2023   Pregnancy 10/04/2013   Normal labor 10/03/2013   Abdominal pain in pregnancy, antepartum 09/12/2013    REFERRING PROVIDER: Dr. Almond Lint  REFERRING DIAG: Right breast cancer  THERAPY DIAG:  Malignant neoplasm of upper-outer quadrant of right breast in female, estrogen receptor positive (HCC)  Abnormal posture  Rationale for Evaluation and Treatment: Rehabilitation  ONSET DATE: 03/23/2023  SUBJECTIVE:                                                                                                                                                                                           SUBJECTIVE STATEMENT: Patient reports she is here today to be seen by her medical team for her newly diagnosed right breast cancer.   PERTINENT HISTORY:  Patient was diagnosed on 03/23/2023 with right grade 2 invasive ductal carcinoma breast cancer. It measures  1.2 cm and is located in the upper outer quadrant. It is triple positive with a Ki67 of 35%.   PATIENT GOALS:   reduce lymphedema risk and learn post op HEP.   PAIN:  Are you having pain? No  PRECAUTIONS: Active CA   HAND DOMINANCE: right  WEIGHT BEARING RESTRICTIONS: No  FALLS:  Has patient fallen in last 6 months? No  LIVING ENVIRONMENT: Patient lives with: Husband, 3 y.o. daughter, and 51 y.o. son Lives in: House/apartment Has following equipment at home:  None  OCCUPATION: Full time sales rep for various brands; travels alot  LEISURE: She does not exercise  PRIOR LEVEL OF FUNCTION: Independent   OBJECTIVE:  COGNITION: Overall cognitive status: Within functional limits for tasks assessed    POSTURE:  Forward head and rounded shoulders posture  UPPER EXTREMITY AROM/PROM:  A/PROM RIGHT   eval   Shoulder extension 52  Shoulder flexion 129  Shoulder abduction 154  Shoulder internal rotation 65  Shoulder external rotation 76    (Blank rows = not tested)  A/PROM LEFT   eval  Shoulder extension 56  Shoulder flexion 143  Shoulder abduction 155  Shoulder internal rotation 57  Shoulder external rotation 74    (Blank rows = not tested)  CERVICAL AROM: All within normal limits  UPPER EXTREMITY STRENGTH: WNL  LYMPHEDEMA ASSESSMENTS:   LANDMARK RIGHT   eval  10 cm proximal to olecranon process 24.8  Olecranon process 23.6  10 cm proximal to ulnar styloid process 21.5  Just proximal to ulnar styloid process 15.2  Across hand at thumb web space 18.3  At base of 2nd digit 6.2  (Blank rows = not tested)  LANDMARK LEFT   eval  10 cm proximal to olecranon process 26  Olecranon process 24  10 cm proximal to ulnar styloid process 20.1  Just proximal to ulnar styloid process 15.3  Across hand at thumb web space 17.5  At base of 2nd digit 5.8  (Blank rows = not tested)  L-DEX LYMPHEDEMA SCREENING:  The patient was assessed using the L-Dex machine today  to produce a lymphedema index baseline score. The patient will be reassessed on a regular basis (typically every 3 months) to obtain new L-Dex scores. If the score is > 6.5 points away from his/her baseline score indicating onset of subclinical lymphedema, it will be recommended to wear a compression garment for 4 weeks, 12 hours per day and then be reassessed. If the score continues to be > 6.5 points from baseline at reassessment, we will initiate lymphedema treatment. Assessing in this manner has a 95% rate of preventing clinically significant lymphedema.   L-DEX FLOWSHEETS - 04/15/23 0900       L-DEX LYMPHEDEMA SCREENING   Measurement Type Unilateral    L-DEX MEASUREMENT EXTREMITY Upper Extremity    POSITION  Standing    DOMINANT SIDE Right    At Risk Side Right    BASELINE SCORE (UNILATERAL) -6.2             QUICK DASH SURVEY:  Neldon Mc - 04/15/23 0001     Open a tight or new jar No difficulty    Do heavy household chores (wash walls, wash floors) No difficulty    Carry a shopping bag or briefcase No difficulty    Wash your back No difficulty    Use a knife to cut food No difficulty    Recreational activities in which you take some force or impact through your arm, shoulder, or hand (golf, hammering, tennis) No difficulty    During the past week, to what extent has your arm, shoulder or hand problem interfered with your normal social activities with family, friends, neighbors, or groups? Not at all    During the past week, to what extent has your arm, shoulder or hand problem limited your work or other regular daily activities Not at all    Arm, shoulder, or hand pain. None    Tingling (pins and needles) in your arm, shoulder, or hand None  Difficulty Sleeping No difficulty    DASH Score 0 %               PATIENT EDUCATION:  Education details: Lymphedema risk reduction and post op shoulder/posture HEP Person educated: Patient Education method: Explanation,  Demonstration, Handout Education comprehension: Patient verbalized understanding and returned demonstration  HOME EXERCISE PROGRAM: Patient was instructed today in a home exercise program today for post op shoulder range of motion. These included active assist shoulder flexion in sitting, scapular retraction, wall walking with shoulder abduction, and hands behind head external rotation.  She was encouraged to do these twice a day, holding 3 seconds and repeating 5 times when permitted by her physician.   ASSESSMENT:  CLINICAL IMPRESSION: Patient was diagnosed on 03/23/2023 with right grade 2 invasive ductal carcinoma breast cancer. It measures 1.2 cm and is located in the upper outer quadrant. It is triple positive with a Ki67 of 35%. Her multidisciplinary medical team met prior to her assessments to determine a recommended treatment plan. She is planning to have a right lumpectomy and sentinel node biopsy followed by chemotherapy, radiation, and anti-estrogen therapy. She will benefit from a post op PT reassessment to determine needs and from L-Dex screens every 3 months for 2 years to detect subclinical lymphedema.  Pt will benefit from skilled therapeutic intervention to improve on the following deficits: Decreased knowledge of precautions, impaired UE functional use, pain, decreased ROM, postural dysfunction.   PT treatment/interventions: ADL/self-care home management, pt/family education, therapeutic exercise  REHAB POTENTIAL: Excellent  CLINICAL DECISION MAKING: Stable/uncomplicated  EVALUATION COMPLEXITY: Low   GOALS: Goals reviewed with patient? YES  LONG TERM GOALS: (STG=LTG)    Name Target Date Goal status  1 Pt will be able to verbalize understanding of pertinent lymphedema risk reduction practices relevant to her dx specifically related to skin care.  Baseline:  No knowledge 04/15/2023 Achieved at eval  2 Pt will be able to return demo and/or verbalize understanding of the  post op HEP related to regaining shoulder ROM. Baseline:  No knowledge 04/15/2023 Achieved at eval  3 Pt will be able to verbalize understanding of the importance of attending the post op After Breast CA Class for further lymphedema risk reduction education and therapeutic exercise.  Baseline:  No knowledge 04/15/2023 Achieved at eval  4 Pt will demo she has regained full shoulder ROM and function post operatively compared to baselines.  Baseline: See objective measurements taken today. 06/10/2023     PLAN:  PT FREQUENCY/DURATION: EVAL and 1 follow up appointment.   PLAN FOR NEXT SESSION: will reassess 3-4 weeks post op to determine needs.   Patient will follow up at outpatient cancer rehab 3-4 weeks following surgery.  If the patient requires physical therapy at that time, a specific plan will be dictated and sent to the referring physician for approval. The patient was educated today on appropriate basic range of motion exercises to begin post operatively and the importance of attending the After Breast Cancer class following surgery.  Patient was educated today on lymphedema risk reduction practices as it pertains to recommendations that will benefit the patient immediately following surgery.  She verbalized good understanding.    Physical Therapy Information for After Breast Cancer Surgery/Treatment:  Lymphedema is a swelling condition that you may be at risk for in your arm if you have lymph nodes removed from the armpit area.  After a sentinel node biopsy, the risk is approximately 5-9% and is higher after an axillary node dissection.  There is treatment available for this condition and it is not life-threatening.  Contact your physician or physical therapist with concerns. You may begin the 4 shoulder/posture exercises (see additional sheet) when permitted by your physician (typically a week after surgery).  If you have drains, you may need to wait until those are removed before beginning  range of motion exercises.  A general recommendation is to not lift your arms above shoulder height until drains are removed.  These exercises should be done to your tolerance and gently.  This is not a "no pain/no gain" type of recovery so listen to your body and stretch into the range of motion that you can tolerate, stopping if you have pain.  If you are having immediate reconstruction, ask your plastic surgeon about doing exercises as he or she may want you to wait. We encourage you to attend the free one time ABC (After Breast Cancer) class offered by [facility].  You will learn information related to lymphedema risk, prevention and treatment and additional exercises to regain mobility following surgery.  You can call [phone] for more information.  This is offered the 1st and 3rd Monday of each month.  You only attend the class one time. While undergoing any medical procedure or treatment, try to avoid blood pressure being taken or needle sticks from occurring on the arm on the side of cancer.   This recommendation begins after surgery and continues for the rest of your life.  This may help reduce your risk of getting lymphedema (swelling in your arm). An excellent resource for those seeking information on lymphedema is the National Lymphedema Network's web site. It can be accessed at www.lymphnet.org If you notice swelling in your hand, arm or breast at any time following surgery (even if it is many years from now), please contact your doctor or physical therapist to discuss this.  Lymphedema can be treated at any time but it is easier for you if it is treated early on.  If you feel like your shoulder motion is not returning to normal in a reasonable amount of time, please contact your surgeon or physical therapist.  [facility] [phone]. [address], [address], [place] [address]  ABC CLASS After Breast Cancer Class  After  Breast Cancer Class is a specially designed exercise class to assist you in a safe recover after having breast cancer surgery.  In this class you will learn how to get back to full function whether your drains were just removed or if you had surgery a month ago.  This one-time class is held the 1st and 3rd Monday of every month from 11:00 a.m. until 12:00 noon virtually.  This class is FREE and space is limited. For more information or to register for the next available class, call [phone].  Class Goals  Understand specific stretches to improve the flexibility of you chest and shoulder. Learn ways to safely strengthen your upper body and improve your posture. Understand the warning signs of infection and why you may be at risk for an arm infection. Learn about Lymphedema and prevention.  ** You do not attend this class until after surgery.  Drains must be removed to participate  Patient was instructed today in a home exercise program today for post op shoulder range of motion. These included active assist shoulder flexion in sitting, scapular retraction, wall walking with shoulder abduction, and hands behind head external rotation.  She was  encouraged to do these twice a day, holding 3 seconds and repeating 5 times when permitted by her physician.  Bethann Punches, [place] 04/15/23 12:14 PM

## 2023-04-15 NOTE — Progress Notes (Signed)
REFERRING PROVIDER: Rachel Moulds, MD [address] [place],  [place] [address]  PRIMARY PROVIDER:  None listed  PRIMARY REASON FOR VISIT:  Encounter Diagnoses  Name Primary?   Malignant neoplasm of upper-outer quadrant of right breast in female, estrogen receptor positive (HCC) Yes   Family history of colonic polyps    Family history of prostate cancer      HISTORY OF PRESENT ILLNESS:   Casey [name], a 47 y.o. female, was seen for a  cancer genetics consultation during the breast multidisciplinary clinic at the request of Dr. Al Pimple due to a personal history of breast cancer.  Casey [name] presents to clinic today to discuss the possibility of a hereditary predisposition to cancer, to discuss genetic testing, and to further clarify her future cancer risks, as well as potential cancer risks for family members.   In 2024, at the age of 7, Casey [name] was diagnosed with invasive ductal carcinoma of the right breast (ER+/PR+/HER2+). The preliminary treatment plan is pending.   Oncology History  Malignant neoplasm of upper-outer quadrant of right breast in female, estrogen receptor positive (HCC)  04/03/2023 Mammogram   Mammogram and ultrasound showed right breast 12:00 suspicious mass for which ultrasound-guided core needle biopsy is recommended, this mass measured 0.9 x 1.1 x 1.2 cm.  Evaluation of right axilla demonstrates no evidence of lymphadenopathy.    04/06/2023 Pathology Results   Right breast needle core biopsy at 12:00 showed overall grade 2 invasive ductal carcinoma, prognostic showed ER 99% positive strong staining PR 99% positive strong staining, Ki-67 of 35% and group 1 HER2 amplified.  Ratio of HER2/CEN signals of 2.53   04/13/2023 Initial Diagnosis   Malignant neoplasm of upper-outer quadrant of right breast in female, estrogen receptor positive (HCC)   04/15/2023 Cancer Staging   Staging form: Breast, AJCC 8th Edition - Clinical stage from 04/15/2023: Stage IA (cT1c, cN0,  cM0, G2, ER+, PR+, HER2+) - Signed by Rachel Moulds, MD on 04/15/2023 Stage prefix: Initial diagnosis Histologic grading system: 3 grade system Laterality: Right Staged by: Pathologist and managing physician     RISK FACTORS:  Colonoscopy: yes;  most recent October 2023; f/u 5 years --1 sessile serrated polyps, 2 hyperplastic polyps   Past Medical History:  Diagnosis Date   Anxiety    History of kidney stones     Past Surgical History:  Procedure Laterality Date   BREAST BIOPSY Right 04/06/2023   BREAST BIOPSY Right 04/06/2023   [place] RT BREAST BX W LOC DEV 1ST LESION IMG BX SPEC US GUIDE 04/06/2023 [facility] MAMMOGRAPHY   DILATION AND CURETTAGE OF UTERUS     x 2   WISDOM TOOTH EXTRACTION       FAMILY HISTORY:  We obtained a detailed, 4-generation family history.  Significant diagnoses are listed below: Family History  Problem Relation Age of Onset   Colon polyps Father        more than 10 lifetime polyps   Colon polyps Sister        more than 10 lifetime polyps   Colon polyps Brother        more than 10 lifetime polyps   Prostate cancer Maternal Grandfather        dx 12s; no mets      Casey [name] sister, Casey [name], reported negative genetic testing through [facility]/her GI provider within the past 5 years (referred for personal and family history of colon polyps).     There is no reported Ashkenazi Jewish ancestry. There  is no known consanguinity.  GENETIC COUNSELING ASSESSMENT: Casey [name] is a 47 y.o. female with a personal history of breast cancer which is somewhat suggestive of a hereditary cancer syndrome and predisposition to cancer given her age of diagnosis. We, therefore, discussed and recommended the following at today's visit.   DISCUSSION: We discussed that 5 - 10% of cancer is hereditary, with most cases of hereditary breast cancer associated with mutations in BRCA1/2.  There are other genes that can be associated with hereditary breast cancer syndromes.  Type of  cancer risk and level of risk are gene-specific. We discussed that testing is beneficial for several reasons including knowing how to follow individuals after completing their treatment, identifying whether potential treatment/surgery options would be beneficial, and understanding if other family members could be at risk for cancer and allowing them to undergo genetic testing.   We reviewed the characteristics, features and inheritance patterns of hereditary cancer syndromes. We also discussed genetic testing, including the appropriate family members to test, the process of testing, insurance coverage and turn-around-time for results. We discussed the implications of a negative, positive and/or variant of uncertain significant result. In order to get genetic test results in a timely manner so that Casey [name] can use these genetic test results for surgical decisions, we recommended Casey [name] pursue genetic testing for the [facility] Breast Cancer STAT Panel. The STAT Breast cancer panel offered by [facility] includes sequencing and rearrangement analysis for the following 9 genes:  ATM, BRCA1, BRCA2, CDH1, CHEK2, PALB2, PTEN, STK11 and TP53.  Once complete, we recommend Casey [name] pursue reflex genetic testing to a more comprehensive gene panel.    The [facility] Common Hereditary Cancers + RNA Panel includes sequencing, deletion/duplication, and RNA analysis of the following 48 genes: APC, ATM, AXIN2, BAP1, BARD1, BMPR1A, BRCA1, BRCA2, BRIP1, CDH1, CDK4*, CDKN2A*, CHEK2, CTNNA1, DICER1, EPCAM* (del/dup only), FH, GREM1* (promoter dup analysis only), HOXB13*, KIT*, MBD4*, MEN1, MLH1, MSH2, MSH3, MSH6, MUTYH, NF1, NTHL1, PALB2, PDGFRA*, PMS2, POLD1, POLE, PTEN, RAD51C, RAD51D, SDHA (sequencing only), SDHB, SDHC, SDHD, SMAD4, SMARCA4, STK11, TP53, TSC1, TSC2, VHL.  *Genes without RNA analysis.   Based on Casey [name]'s personal history of breast cancer before age 9, she meets medical criteria for genetic testing. Despite that  she meets criteria, she may still have an out of pocket cost. We discussed that if her out of pocket cost for testing is over $100, the laboratory should contact them to discuss self-pay prices, patient pay assistance programs, if applicable, and other billing options.   PLAN: After considering the risks, benefits, and limitations, Casey [name] provided informed consent to pursue genetic testing and the blood sample was sent to [facility] for analysis of the STAT and Common Hereditary Cancers +RNA Panel. Results should be available within approximately 1-2 weeks' time, at which point they will be disclosed by telephone to Casey [name], as will any additional recommendations warranted by these results. Casey [name] will receive a summary of her genetic counseling visit and a copy of her results once available. This information will also be available in Epic.   Casey [name] questions were answered to her satisfaction today. Our contact information was provided should additional questions or concerns arise. Thank you for the referral and allowing [place] to share in the care of your patient.   Casey Bonanno M. Rennie Plowman, MS, [facility] Genetic Counselor [email or web address] (P) [phone]  The patient was seen for a total of 16 minutes in face-to-face genetic counseling.  The patient was accompanied by  her sister, Casey [name].  Dr. Al Pimple was available to discuss this case as needed.  _______________________________________________________________________ For Office Staff:  Number of people involved in session: 2 Was an Intern/ student involved with case: no

## 2023-04-15 NOTE — Telephone Encounter (Signed)
Patient needs something to take prior to her MRI for anxiety.  Rx called into her pharmacy.

## 2023-04-15 NOTE — Progress Notes (Signed)
[facility] CONSULT NOTE  Patient Care Team: Patient, No Pcp Per as PCP - General (General Practice) Pershing Proud, RN as Oncology Nurse Navigator Donnelly Angelica, RN as Oncology Nurse Navigator Rachel Moulds, MD as Consulting Physician (Hematology and Oncology) Dorothy Puffer, MD as Consulting Physician (Radiation Oncology) Almond Lint, MD as Consulting Physician (General Surgery)  CHIEF COMPLAINTS/PURPOSE OF CONSULTATION:  Newly diagnosed breast cancer  HISTORY OF PRESENTING ILLNESS:  Casey [name] 47 y.o. female is here because of recent diagnosis of right breast IDC  I reviewed her records extensively and collaborated the history with the patient.  SUMMARY OF ONCOLOGIC HISTORY: Oncology History  Malignant neoplasm of upper-outer quadrant of right breast in female, estrogen receptor positive (HCC)  04/03/2023 Mammogram   Mammogram and ultrasound showed right breast 12:00 suspicious mass for which ultrasound-guided core needle biopsy is recommended, this mass measured 0.9 x 1.1 x 1.2 cm.  Evaluation of right axilla demonstrates no evidence of lymphadenopathy.    04/06/2023 Pathology Results   Right breast needle core biopsy at 12:00 showed overall grade 2 invasive ductal carcinoma, prognostic showed ER 99% positive strong staining PR 99% positive strong staining, Ki-67 of 35% and group 1 HER2 amplified.  Ratio of HER2/CEN signals of 2.53   04/13/2023 Initial Diagnosis   Malignant neoplasm of upper-outer quadrant of right breast in female, estrogen receptor positive (HCC)   04/15/2023 Cancer Staging   Staging form: Breast, AJCC 8th Edition - Clinical stage from 04/15/2023: Stage IA (cT1c, cN0, cM0, G2, ER+, PR+, HER2+) - Signed by Rachel Moulds, MD on 04/15/2023 Stage prefix: Initial diagnosis Histologic grading system: 3 grade system Laterality: Right Staged by: Pathologist and managing physician    Patient arrived to the appointment today with his sister.   She is very healthy at baseline except for some weight issues, she has been using semaglutide and lost about 20 pounds.  She denies any family history of breast cancer.  She has 2 children 49 and 1 years old.  Rest of the pertinent 10 point ROS reviewed and negative  MEDICAL HISTORY:  Past Medical History:  Diagnosis Date   Anxiety    History of kidney stones     SURGICAL HISTORY: Past Surgical History:  Procedure Laterality Date   BREAST BIOPSY Right 04/06/2023   BREAST BIOPSY Right 04/06/2023   [place] RT BREAST BX W LOC DEV 1ST LESION IMG BX SPEC US GUIDE 04/06/2023 [facility] MAMMOGRAPHY   DILATION AND CURETTAGE OF UTERUS     x 2   WISDOM TOOTH EXTRACTION      SOCIAL HISTORY: Social History   Socioeconomic History   Marital status: Married    Spouse name: Not on file   Number of children: Not on file   Years of education: Not on file   Highest education level: Not on file  Occupational History   Not on file  Tobacco Use   Smoking status: Never    Passive exposure: Never   Smokeless tobacco: Never  Vaping Use   Vaping Use: Never used  Substance and Sexual Activity   Alcohol use: Yes    Comment: rarely   Drug use: No   Sexual activity: Yes    Birth control/protection: None  Other Topics Concern   Not on file  Social History Narrative   Not on file   Social Determinants of Health   Financial Resource Strain: Not on file  Food Insecurity: Not on file  Transportation Needs: Not on file  Physical Activity: Not on file  Stress: Not on file  Social Connections: Not on file  Intimate Partner Violence: Not on file    FAMILY HISTORY: Family History  Problem Relation Age of Onset   Healthy Mother    Colon polyps Father    Healthy Father    Colon polyps Sister    Colon polyps Brother    Alcohol abuse Neg Hx    Arthritis Neg Hx    Asthma Neg Hx    Birth defects Neg Hx    Cancer Neg Hx    COPD Neg Hx    Depression Neg Hx    Diabetes Neg Hx    Drug abuse Neg Hx     Early death Neg Hx    Hearing loss Neg Hx    Heart disease Neg Hx    Hyperlipidemia Neg Hx    Hypertension Neg Hx    Kidney disease Neg Hx    Learning disabilities Neg Hx    Mental illness Neg Hx    Mental retardation Neg Hx    Miscarriages / Stillbirths Neg Hx    Stroke Neg Hx    Vision loss Neg Hx    Varicose Veins Neg Hx    Colon cancer Neg Hx    Crohn's disease Neg Hx    Esophageal cancer Neg Hx    Stomach cancer Neg Hx    Rectal cancer Neg Hx    Ulcerative colitis Neg Hx     ALLERGIES:  is allergic to shellfish allergy.  MEDICATIONS:  Current Outpatient Medications  Medication Sig Dispense Refill   ADDERALL XR 15 MG 24 hr capsule Take by mouth daily.     calcium carbonate (TUMS - DOSED IN MG ELEMENTAL CALCIUM) 500 MG chewable tablet Chew 2 tablets by mouth 2 (two) times daily as needed for indigestion or heartburn. (Patient not taking: Reported on 07/11/2022)     Current Facility-Administered Medications  Medication Dose Route Frequency Provider Last Rate Last Admin   0.9 %  sodium chloride infusion  500 mL Intravenous Continuous Pyrtle, Carie Caddy, MD        REVIEW OF SYSTEMS:   Constitutional: Denies fevers, chills or abnormal night sweats Eyes: Denies blurriness of vision, double vision or watery eyes Ears, nose, mouth, throat, and face: Denies mucositis or sore throat Respiratory: Denies cough, dyspnea or wheezes Cardiovascular: Denies palpitation, chest discomfort or lower extremity swelling Gastrointestinal:  Denies nausea, heartburn or change in bowel habits Skin: Denies abnormal skin rashes Lymphatics: Denies new lymphadenopathy or easy bruising Neurological:Denies numbness, tingling or new weaknesses Behavioral/Psych: Mood is stable, no new changes  Breast: Denies any palpable lumps or discharge All other systems were reviewed with the patient and are negative.  PHYSICAL EXAMINATION: ECOG PERFORMANCE STATUS: 0 - Asymptomatic  Vitals:   04/15/23 0921  BP:  112/84  Pulse: 87  Resp: 18  Temp: (!) 97.5 F (36.4 C)  SpO2: 100%   Filed Weights   04/15/23 0921  Weight: 143 lb 3.2 oz (65 kg)    GENERAL:alert, no distress and comfortable LYMPH:  no palpable lymphadenopathy in the cervical, axillary  BREAST: Dense breast noted.  Postbiopsy changes, the mass is not very well-defined.  No axillary adenopathy  LABORATORY DATA:  I have reviewed the data as listed Lab Results  Component Value Date   WBC 4.1 04/15/2023   HGB 13.9 04/15/2023   HCT 40.5 04/15/2023   MCV 94.8 04/15/2023   PLT 256 04/15/2023   Lab  Results  Component Value Date   NA 137 04/15/2023   K 4.0 04/15/2023   CL 106 04/15/2023   CO2 27 04/15/2023    RADIOGRAPHIC STUDIES: I have personally reviewed the radiological reports and agreed with the findings in the report.  ASSESSMENT AND PLAN:  Malignant neoplasm of upper-outer quadrant of right breast in female, estrogen receptor positive (HCC) This is a very pleasant 47 year old premenopausal female patient with newly diagnosed right breast upper outer quadrant invasive ductal carcinoma, grade 2 ER/PR and HER2 positive diagnosed on screening mammogram presented with her sister for initial evaluation to our breast MDC.  Patient is healthy at baseline.  There is no family history of any cancer.  She denies any palpable masses before the mammogram.  She has had some recalls and ultrasounds given her dense breast but no abnormal biopsies.  Given her small tumor, tumor under 2 cm and node-negative status, we have discussed about upfront surgery followed by adjuvant chemotherapy given triple positive.  We have discussed about chemotherapy with doublet chemo which is carboplatin and docetaxel with Herceptin versus Taxol and Herceptin.  We have discussed briefly about adverse effects from chemotherapy including but not limited to fatigue, nausea, vomiting, diarrhea, cytopenias, immunocompromise status, increased risk of infections,  neuropathy, cardiotoxicity etc.  She understands that some of the side effects can be permanent.  Post chemo, she will proceed with adjuvant radiation followed by antiestrogen therapy.  Once again given her premenopausal status, we have discussed about tamoxifen as her choice of antiestrogen therapy.  We have discussed about the mechanism of action of tamoxifen, adverse effects including but not limited to side effects such as hot flashes, mood swings, vaginal discharge, risk of DVT/PE, endometrial hyperplasia, endometrial carcinoma and benefit on bone density.  At this time she will return to clinic after surgery to discuss additional adjuvant recommendations.   All questions were answered. The patient knows to call the clinic with any problems, questions or concerns.    Rachel Moulds, MD 04/15/23

## 2023-04-16 ENCOUNTER — Ambulatory Visit (HOSPITAL_COMMUNITY)
Admission: RE | Admit: 2023-04-16 | Discharge: 2023-04-16 | Disposition: A | Discharge status: Home / Self Care | Payer: BC Managed Care – PPO | Source: Ambulatory Visit | Attending: General Surgery | Admitting: General Surgery

## 2023-04-16 DIAGNOSIS — Z170 Estrogen receptor positive status [ER+]: Secondary | ICD-10-CM | POA: Insufficient documentation

## 2023-04-16 DIAGNOSIS — D0511 Intraductal carcinoma in situ of right breast: Secondary | ICD-10-CM | POA: Diagnosis not present

## 2023-04-16 DIAGNOSIS — C50411 Malignant neoplasm of upper-outer quadrant of right female breast: Secondary | ICD-10-CM | POA: Diagnosis not present

## 2023-04-16 MED ORDER — GADOBUTROL 1 MMOL/ML IV SOLN
7.0000 mL | Freq: Once | INTRAVENOUS | Status: AC | PRN
  Administered 2023-04-16: 7 mL via INTRAVENOUS

## 2023-04-17 ENCOUNTER — Other Ambulatory Visit: Payer: Self-pay | Admitting: *Deleted

## 2023-04-17 ENCOUNTER — Telehealth: Payer: Self-pay | Admitting: *Deleted

## 2023-04-17 ENCOUNTER — Ambulatory Visit (HOSPITAL_COMMUNITY)
Admission: RE | Admit: 2023-04-17 | Discharge: 2023-04-17 | Disposition: A | Discharge status: Home / Self Care | Payer: BC Managed Care – PPO | Source: Ambulatory Visit | Attending: Hematology and Oncology | Admitting: Hematology and Oncology

## 2023-04-17 DIAGNOSIS — R928 Other abnormal and inconclusive findings on diagnostic imaging of breast: Secondary | ICD-10-CM | POA: Insufficient documentation

## 2023-04-17 DIAGNOSIS — Z170 Estrogen receptor positive status [ER+]: Secondary | ICD-10-CM | POA: Diagnosis not present

## 2023-04-17 DIAGNOSIS — C50411 Malignant neoplasm of upper-outer quadrant of right female breast: Secondary | ICD-10-CM | POA: Insufficient documentation

## 2023-04-17 DIAGNOSIS — C50919 Malignant neoplasm of unspecified site of unspecified female breast: Secondary | ICD-10-CM | POA: Diagnosis not present

## 2023-04-17 DIAGNOSIS — R160 Hepatomegaly, not elsewhere classified: Secondary | ICD-10-CM

## 2023-04-17 DIAGNOSIS — K769 Liver disease, unspecified: Secondary | ICD-10-CM | POA: Diagnosis not present

## 2023-04-17 MED ORDER — GADOBUTROL 1 MMOL/ML IV SOLN
6.5000 mL | Freq: Once | INTRAVENOUS | Status: AC | PRN
  Administered 2023-04-17: 6.5 mL via INTRAVENOUS

## 2023-04-17 NOTE — Telephone Encounter (Signed)
Spoke with patient to discuss results of her MRI and the need for abdominal MRI to evaluate liver mass.  She was originally ok with waiting till she came back to get this done but received call back and it's weighing on her mind. I was able to get patient schedule for this evening 7/12 at 6pm at [facility].  Patient is aware and very grateful.

## 2023-04-22 ENCOUNTER — Telehealth: Payer: Self-pay | Admitting: Radiology

## 2023-04-22 NOTE — Telephone Encounter (Signed)
[employer] 2021-05 - Specimen Collection Study to Evaluate Biomarkers in Subjects with Cancer    04/22/23  FOLLOW-UP PHONE CALL: Confirmed I was speaking with Casey [name] . Introduced myself and informed patient reason for call is to follow-up on the above mentioned study. Patient is busy this week and prefers a call back next week. This coordinator expressed understanding and will call back next week. Thanked patient for her time.   Merri Brunette, RT(R)(T) Clinical Research Coordinator

## 2023-04-23 ENCOUNTER — Encounter: Payer: Self-pay | Admitting: *Deleted

## 2023-04-23 ENCOUNTER — Telehealth: Payer: Self-pay | Admitting: *Deleted

## 2023-04-23 ENCOUNTER — Other Ambulatory Visit: Payer: Self-pay | Admitting: General Surgery

## 2023-04-23 NOTE — Telephone Encounter (Signed)
Late entry from 7/15- Spoke with patient to let her know that her MRI abdomen was benign hemangioma.  Discussed any further needs. Patient denies any at this time. Encouraged her to call should she have any questions or concerns.

## 2023-04-27 ENCOUNTER — Encounter: Payer: Self-pay | Admitting: Genetic Counselor

## 2023-04-27 ENCOUNTER — Ambulatory Visit: Payer: Self-pay | Admitting: Genetic Counselor

## 2023-04-27 ENCOUNTER — Encounter: Payer: Self-pay | Admitting: *Deleted

## 2023-04-27 ENCOUNTER — Telehealth: Payer: Self-pay | Admitting: Genetic Counselor

## 2023-04-27 DIAGNOSIS — Z1379 Encounter for other screening for genetic and chromosomal anomalies: Secondary | ICD-10-CM

## 2023-04-27 DIAGNOSIS — C50411 Malignant neoplasm of upper-outer quadrant of right female breast: Secondary | ICD-10-CM

## 2023-04-27 NOTE — Progress Notes (Signed)
HPI:  Casey [name] was previously seen in the [facility] due to a personal and family history of cancer and concerns regarding a hereditary predisposition to cancer. Please refer to our prior cancer genetics clinic note for more information regarding our discussion, assessment and recommendations, at the time. Casey [name] recent genetic test results were disclosed to her, as were recommendations warranted by these results. These results and recommendations are discussed in more detail below.  CANCER HISTORY:  Oncology History  Malignant neoplasm of upper-outer quadrant of right breast in female, estrogen receptor positive (HCC)  04/03/2023 Mammogram   Mammogram and ultrasound showed right breast 12:00 suspicious mass for which ultrasound-guided core needle biopsy is recommended, this mass measured 0.9 x 1.1 x 1.2 cm.  Evaluation of right axilla demonstrates no evidence of lymphadenopathy.    04/06/2023 Pathology Results   Right breast needle core biopsy at 12:00 showed overall grade 2 invasive ductal carcinoma, prognostic showed ER 99% positive strong staining PR 99% positive strong staining, Ki-67 of 35% and group 1 HER2 amplified.  Ratio of HER2/CEN signals of 2.53   04/13/2023 Initial Diagnosis   Malignant neoplasm of upper-outer quadrant of right breast in female, estrogen receptor positive (HCC)   04/15/2023 Cancer Staging   Staging form: Breast, AJCC 8th Edition - Clinical stage from 04/15/2023: Stage IA (cT1c, cN0, cM0, G2, ER+, PR+, HER2+) - Signed by Rachel Moulds, MD on 04/15/2023 Stage prefix: Initial diagnosis Histologic grading system: 3 grade system Laterality: Right Staged by: Pathologist and managing physician   04/26/2023 Genetic Testing   Negative genetic testing on the Common Hereditary Cancer panel + RNA.  The report date is April 26, 2023.  The Common Hereditary Gene Panel offered by [employer] includes sequencing and/or deletion duplication testing of the  following 47 genes: APC, ATM, AXIN2, BARD1, BMPR1A, BRCA1, BRCA2, BRIP1, CDH1, CDK4, CDKN2A (p14ARF), CDKN2A (p16INK4a), CHEK2, CTNNA1, DICER1, EPCAM (Deletion/duplication testing only), GREM1 (promoter region deletion/duplication testing only), KIT, MEN1, MLH1, MSH2, MSH3, MSH6, MUTYH, NBN, NF1, NHTL1, PALB2, PDGFRA, PMS2, POLD1, POLE, PTEN, RAD50, RAD51C, RAD51D, SDHB, SDHC, SDHD, SMAD4, SMARCA4. STK11, TP53, TSC1, TSC2, and VHL.  The following genes were evaluated for sequence changes only: SDHA and HOXB13 c.251G>A variant only.      FAMILY HISTORY:  We obtained a detailed, 4-generation family history.  Significant diagnoses are listed below: Family History  Problem Relation Age of Onset   Healthy Mother    Colon polyps Father        more than 10 lifetime polyps   Healthy Father    Colon polyps Sister        more than 10 lifetime polyps   Colon polyps Brother        more than 10 lifetime polyps   Prostate cancer Maternal Grandfather        dx 73s; no mets   Alcohol abuse Neg Hx    Arthritis Neg Hx    Asthma Neg Hx    Birth defects Neg Hx    Cancer Neg Hx    COPD Neg Hx    Depression Neg Hx    Diabetes Neg Hx    Drug abuse Neg Hx    Early death Neg Hx    Hearing loss Neg Hx    Heart disease Neg Hx    Hyperlipidemia Neg Hx    Hypertension Neg Hx    Kidney disease Neg Hx    Learning disabilities Neg Hx    Mental illness Neg  Hx    Mental retardation Neg Hx    Miscarriages / Stillbirths Neg Hx    Stroke Neg Hx    Vision loss Neg Hx    Varicose Veins Neg Hx    Colon cancer Neg Hx    Crohn's disease Neg Hx    Esophageal cancer Neg Hx    Stomach cancer Neg Hx    Rectal cancer Neg Hx    Ulcerative colitis Neg Hx          Casey [name]'s sister, Casey [name], reported negative genetic testing through [facility]/her GI provider within the past 5 years (referred for personal and family history of colon polyps).      There is no reported Ashkenazi Jewish ancestry. There is no known  consanguinity  GENETIC TEST RESULTS: Genetic testing reported out on April 26, 2023 through the Common Hereditary cancer panel + RNA found no pathogenic mutations. The Common Hereditary Gene Panel offered by [facility] includes sequencing and/or deletion duplication testing of the following 47 genes: APC, ATM, AXIN2, BARD1, BMPR1A, BRCA1, BRCA2, BRIP1, CDH1, CDK4, CDKN2A (p14ARF), CDKN2A (p16INK4a), CHEK2, CTNNA1, DICER1, EPCAM (Deletion/duplication testing only), GREM1 (promoter region deletion/duplication testing only), KIT, MEN1, MLH1, MSH2, MSH3, MSH6, MUTYH, NBN, NF1, NHTL1, PALB2, PDGFRA, PMS2, POLD1, POLE, PTEN, RAD50, RAD51C, RAD51D, SDHB, SDHC, SDHD, SMAD4, SMARCA4. STK11, TP53, TSC1, TSC2, and VHL.  The following genes were evaluated for sequence changes only: SDHA and HOXB13 c.251G>A variant only. The test report has been scanned into [employer] and is located under the Molecular Pathology section of the Results Review tab.  A portion of the result report is included below for reference.     We discussed with Casey [name] that because current genetic testing is not perfect, it is possible there may be a gene mutation in one of these genes that current testing cannot detect, but that chance is small.  We also discussed, that there could be another gene that has not yet been discovered, or that we have not yet tested, that is responsible for the cancer diagnoses in the family. It is also possible there is a hereditary cause for the cancer in the family that Casey [name] did not inherit and therefore was not identified in her testing.  Therefore, it is important to remain in touch with cancer genetics in the future so that we can continue to offer Casey [name] the most up to date genetic testing.   ADDITIONAL GENETIC TESTING: We discussed with Casey [name] that there are other genes that are associated with increased cancer risk that can be analyzed. Should Casey [name] wish to pursue additional genetic testing, we are happy  to discuss and coordinate this testing, at any time.    CANCER SCREENING RECOMMENDATIONS: Casey [name] test result is considered negative (normal).  This means that we have not identified a hereditary cause for her personal and family history of cancer at this time. Most cancers happen by chance and this negative test suggests that her cancer may fall into this category.    Possible reasons for Casey [name]'s negative genetic test include:  1. There may be a gene mutation in one of these genes that current testing methods cannot detect but that chance is small.  2. There could be another gene that has not yet been discovered, or that we have not yet tested, that is responsible for the cancer diagnoses in the family.  3.  There may be no hereditary risk for cancer in the family. The cancers in Ms.  [name] and/or her family may be sporadic/familial or due to other genetic and environmental factors. 4. It is also possible there is a hereditary cause for the cancer in the family that Casey [name] did not inherit.  Therefore, it is recommended she continue to follow the cancer management and screening guidelines provided by her oncology and primary healthcare provider. An individual's cancer risk and medical management are not determined by genetic test results alone. Overall cancer risk assessment incorporates additional factors, including personal medical history, family history, and any available genetic information that may result in a personalized plan for cancer prevention and surveillance  RECOMMENDATIONS FOR FAMILY MEMBERS:  Individuals in this family might be at some increased risk of developing cancer, over the general population risk, simply due to the family history of cancer.  We recommended women in this family have a yearly mammogram beginning at age 59, or 70 years younger than the earliest onset of cancer, an annual clinical breast exam, and perform monthly breast self-exams. Women in this family  should also have a gynecological exam as recommended by their primary provider. All family members should be referred for colonoscopy starting at age 42.  FOLLOW-UP: Lastly, we discussed with Casey [name] that cancer genetics is a rapidly advancing field and it is possible that new genetic tests will be appropriate for her and/or her family members in the future. We encouraged her to remain in contact with cancer genetics on an annual basis so we can update her personal and family histories and let her know of advances in cancer genetics that may benefit this family.   Our contact number was provided. Casey [name] questions were answered to her satisfaction, and she knows she is welcome to call us at anytime with additional questions or concerns.   Maylon Cos, MS, [facility] Licensed, Certified Genetic Counselor [email or web address]

## 2023-04-27 NOTE — Telephone Encounter (Signed)
Revealed negative genetic testing.  Discussed that we do not know why she has breast cancer or why there is cancer in the family. It could be due to a different gene that we are not testing, or maybe our current technology may not be able to pick something up.  It will be important for her to keep in contact with genetics to keep up with whether additional testing may be needed. 

## 2023-04-28 ENCOUNTER — Telehealth: Payer: Self-pay | Admitting: Radiology

## 2023-04-28 ENCOUNTER — Encounter: Payer: Self-pay | Admitting: *Deleted

## 2023-04-28 NOTE — Telephone Encounter (Signed)
[employer] 2021-05 - Specimen Collection Study to Evaluate Biomarkers in Subjects with Cancer    04/27/21  PHONE CALL: LVM for patient concerning the above mentioned study. Encouraged patient to return if she was interested in participation.   Merri Brunette, RT(R)(T) Clinical Research Coordinator

## 2023-04-29 ENCOUNTER — Ambulatory Visit (HOSPITAL_COMMUNITY)
Admission: RE | Admit: 2023-04-29 | Discharge: 2023-04-29 | Disposition: A | Discharge status: Home / Self Care | Payer: BC Managed Care – PPO | Source: Ambulatory Visit | Attending: Hematology and Oncology | Admitting: Hematology and Oncology

## 2023-04-29 DIAGNOSIS — Z170 Estrogen receptor positive status [ER+]: Secondary | ICD-10-CM

## 2023-04-29 DIAGNOSIS — Z01810 Encounter for preprocedural cardiovascular examination: Secondary | ICD-10-CM | POA: Diagnosis not present

## 2023-04-29 DIAGNOSIS — C50411 Malignant neoplasm of upper-outer quadrant of right female breast: Secondary | ICD-10-CM | POA: Diagnosis not present

## 2023-04-29 DIAGNOSIS — Z0189 Encounter for other specified special examinations: Secondary | ICD-10-CM | POA: Diagnosis not present

## 2023-04-29 DIAGNOSIS — C50811 Malignant neoplasm of overlapping sites of right female breast: Secondary | ICD-10-CM | POA: Diagnosis not present

## 2023-04-29 LAB — ECHOCARDIOGRAM COMPLETE
Area-P 1/2: 3.65 cm2
MV VTI: 4.53 cm2
S' Lateral: 2.8 cm

## 2023-04-29 NOTE — Progress Notes (Signed)
  Echocardiogram 2D Echocardiogram has been performed.  Casey [name] 04/29/2023, 10:58 AM

## 2023-04-30 ENCOUNTER — Other Ambulatory Visit: Payer: BC Managed Care – PPO

## 2023-05-01 DIAGNOSIS — C50811 Malignant neoplasm of overlapping sites of right female breast: Secondary | ICD-10-CM | POA: Diagnosis not present

## 2023-05-01 DIAGNOSIS — R928 Other abnormal and inconclusive findings on diagnostic imaging of breast: Secondary | ICD-10-CM | POA: Diagnosis not present

## 2023-05-01 DIAGNOSIS — N6489 Other specified disorders of breast: Secondary | ICD-10-CM | POA: Diagnosis not present

## 2023-05-01 DIAGNOSIS — Z170 Estrogen receptor positive status [ER+]: Secondary | ICD-10-CM | POA: Diagnosis not present

## 2023-05-01 DIAGNOSIS — N6311 Unspecified lump in the right breast, upper outer quadrant: Secondary | ICD-10-CM | POA: Diagnosis not present

## 2023-05-01 DIAGNOSIS — C50411 Malignant neoplasm of upper-outer quadrant of right female breast: Secondary | ICD-10-CM | POA: Diagnosis not present

## 2023-05-07 ENCOUNTER — Telehealth: Payer: Self-pay | Admitting: *Deleted

## 2023-05-07 ENCOUNTER — Encounter: Payer: Self-pay | Admitting: *Deleted

## 2023-05-07 DIAGNOSIS — R928 Other abnormal and inconclusive findings on diagnostic imaging of breast: Secondary | ICD-10-CM | POA: Diagnosis not present

## 2023-05-07 DIAGNOSIS — N6311 Unspecified lump in the right breast, upper outer quadrant: Secondary | ICD-10-CM | POA: Diagnosis not present

## 2023-05-07 NOTE — Telephone Encounter (Signed)
Called pt to verify breast cancer care treatment. Noted sx is scheduled for 8/14 at [facility]. Left vm requesting a return call to determine if pt will continue care at [facility] after sx or if she will have care at [facility]. Contact information provided.

## 2023-05-11 ENCOUNTER — Encounter: Payer: Self-pay | Admitting: *Deleted

## 2023-05-11 DIAGNOSIS — C50411 Malignant neoplasm of upper-outer quadrant of right female breast: Secondary | ICD-10-CM | POA: Diagnosis not present

## 2023-05-11 DIAGNOSIS — Z170 Estrogen receptor positive status [ER+]: Secondary | ICD-10-CM | POA: Diagnosis not present

## 2023-05-18 ENCOUNTER — Telehealth: Payer: Self-pay | Admitting: *Deleted

## 2023-05-18 NOTE — Telephone Encounter (Signed)
Pt returned call and informed she will be transferring all care to [facility]. Physician team notified. Informed pt to reach out with any questions or needs along the way during her cancer care.

## 2023-05-19 DIAGNOSIS — N6315 Unspecified lump in the right breast, overlapping quadrants: Secondary | ICD-10-CM | POA: Diagnosis not present

## 2023-05-19 DIAGNOSIS — C50411 Malignant neoplasm of upper-outer quadrant of right female breast: Secondary | ICD-10-CM | POA: Diagnosis not present

## 2023-05-19 DIAGNOSIS — Z170 Estrogen receptor positive status [ER+]: Secondary | ICD-10-CM | POA: Diagnosis not present

## 2023-05-20 DIAGNOSIS — C50911 Malignant neoplasm of unspecified site of right female breast: Secondary | ICD-10-CM | POA: Diagnosis not present

## 2023-05-20 DIAGNOSIS — C773 Secondary and unspecified malignant neoplasm of axilla and upper limb lymph nodes: Secondary | ICD-10-CM | POA: Diagnosis not present

## 2023-05-20 DIAGNOSIS — Z800 Family history of malignant neoplasm of digestive organs: Secondary | ICD-10-CM | POA: Diagnosis not present

## 2023-05-20 DIAGNOSIS — Z170 Estrogen receptor positive status [ER+]: Secondary | ICD-10-CM | POA: Diagnosis not present

## 2023-05-20 DIAGNOSIS — Z91041 Radiographic dye allergy status: Secondary | ICD-10-CM | POA: Diagnosis not present

## 2023-05-20 DIAGNOSIS — N6001 Solitary cyst of right breast: Secondary | ICD-10-CM | POA: Diagnosis not present

## 2023-05-20 DIAGNOSIS — C50411 Malignant neoplasm of upper-outer quadrant of right female breast: Secondary | ICD-10-CM | POA: Diagnosis not present

## 2023-05-28 ENCOUNTER — Encounter: Payer: Self-pay | Admitting: *Deleted

## 2023-06-05 DIAGNOSIS — Z170 Estrogen receptor positive status [ER+]: Secondary | ICD-10-CM | POA: Diagnosis not present

## 2023-06-05 DIAGNOSIS — C50411 Malignant neoplasm of upper-outer quadrant of right female breast: Secondary | ICD-10-CM | POA: Diagnosis not present

## 2023-06-10 DIAGNOSIS — Z170 Estrogen receptor positive status [ER+]: Secondary | ICD-10-CM | POA: Diagnosis not present

## 2023-06-10 DIAGNOSIS — Z5111 Encounter for antineoplastic chemotherapy: Secondary | ICD-10-CM | POA: Diagnosis not present

## 2023-06-10 DIAGNOSIS — C50411 Malignant neoplasm of upper-outer quadrant of right female breast: Secondary | ICD-10-CM | POA: Diagnosis not present

## 2023-06-10 DIAGNOSIS — Z452 Encounter for adjustment and management of vascular access device: Secondary | ICD-10-CM | POA: Diagnosis not present

## 2023-06-15 DIAGNOSIS — E785 Hyperlipidemia, unspecified: Secondary | ICD-10-CM | POA: Diagnosis not present

## 2023-06-19 DIAGNOSIS — Z5111 Encounter for antineoplastic chemotherapy: Secondary | ICD-10-CM | POA: Diagnosis not present

## 2023-06-19 DIAGNOSIS — Z170 Estrogen receptor positive status [ER+]: Secondary | ICD-10-CM | POA: Diagnosis not present

## 2023-06-19 DIAGNOSIS — C50411 Malignant neoplasm of upper-outer quadrant of right female breast: Secondary | ICD-10-CM | POA: Diagnosis not present

## 2023-06-19 DIAGNOSIS — Z5112 Encounter for antineoplastic immunotherapy: Secondary | ICD-10-CM | POA: Diagnosis not present

## 2023-06-19 DIAGNOSIS — Z79899 Other long term (current) drug therapy: Secondary | ICD-10-CM | POA: Diagnosis not present

## 2023-06-22 DIAGNOSIS — Z170 Estrogen receptor positive status [ER+]: Secondary | ICD-10-CM | POA: Diagnosis not present

## 2023-06-22 DIAGNOSIS — Z0000 Encounter for general adult medical examination without abnormal findings: Secondary | ICD-10-CM | POA: Diagnosis not present

## 2023-06-22 DIAGNOSIS — C50411 Malignant neoplasm of upper-outer quadrant of right female breast: Secondary | ICD-10-CM | POA: Diagnosis not present

## 2023-06-22 DIAGNOSIS — C50919 Malignant neoplasm of unspecified site of unspecified female breast: Secondary | ICD-10-CM | POA: Diagnosis not present

## 2023-07-09 DIAGNOSIS — Z79620 Long term (current) use of immunosuppressive biologic: Secondary | ICD-10-CM | POA: Diagnosis not present

## 2023-07-09 DIAGNOSIS — R197 Diarrhea, unspecified: Secondary | ICD-10-CM | POA: Diagnosis not present

## 2023-07-09 DIAGNOSIS — M2550 Pain in unspecified joint: Secondary | ICD-10-CM | POA: Diagnosis not present

## 2023-07-09 DIAGNOSIS — Z79630 Long term (current) use of alkylating agent: Secondary | ICD-10-CM | POA: Diagnosis not present

## 2023-07-09 DIAGNOSIS — C50411 Malignant neoplasm of upper-outer quadrant of right female breast: Secondary | ICD-10-CM | POA: Diagnosis not present

## 2023-07-09 DIAGNOSIS — Z7969 Long term (current) use of other immunomodulators and immunosuppressants: Secondary | ICD-10-CM | POA: Diagnosis not present

## 2023-07-09 DIAGNOSIS — G4700 Insomnia, unspecified: Secondary | ICD-10-CM | POA: Diagnosis not present

## 2023-07-09 DIAGNOSIS — Z170 Estrogen receptor positive status [ER+]: Secondary | ICD-10-CM | POA: Diagnosis not present

## 2023-07-09 DIAGNOSIS — E876 Hypokalemia: Secondary | ICD-10-CM | POA: Diagnosis not present

## 2023-07-09 DIAGNOSIS — Z5112 Encounter for antineoplastic immunotherapy: Secondary | ICD-10-CM | POA: Diagnosis not present

## 2023-07-09 DIAGNOSIS — Z5181 Encounter for therapeutic drug level monitoring: Secondary | ICD-10-CM | POA: Diagnosis not present

## 2023-07-09 DIAGNOSIS — R112 Nausea with vomiting, unspecified: Secondary | ICD-10-CM | POA: Diagnosis not present

## 2023-07-09 DIAGNOSIS — Z5111 Encounter for antineoplastic chemotherapy: Secondary | ICD-10-CM | POA: Diagnosis not present

## 2023-07-14 DIAGNOSIS — D2272 Melanocytic nevi of left lower limb, including hip: Secondary | ICD-10-CM | POA: Diagnosis not present

## 2023-07-14 DIAGNOSIS — D224 Melanocytic nevi of scalp and neck: Secondary | ICD-10-CM | POA: Diagnosis not present

## 2023-07-14 DIAGNOSIS — L718 Other rosacea: Secondary | ICD-10-CM | POA: Diagnosis not present

## 2023-07-14 DIAGNOSIS — B360 Pityriasis versicolor: Secondary | ICD-10-CM | POA: Diagnosis not present

## 2023-07-20 DIAGNOSIS — R319 Hematuria, unspecified: Secondary | ICD-10-CM | POA: Diagnosis not present

## 2023-07-20 DIAGNOSIS — R300 Dysuria: Secondary | ICD-10-CM | POA: Diagnosis not present

## 2023-07-31 DIAGNOSIS — Z5112 Encounter for antineoplastic immunotherapy: Secondary | ICD-10-CM | POA: Diagnosis not present

## 2023-07-31 DIAGNOSIS — Z5181 Encounter for therapeutic drug level monitoring: Secondary | ICD-10-CM | POA: Diagnosis not present

## 2023-07-31 DIAGNOSIS — D701 Agranulocytosis secondary to cancer chemotherapy: Secondary | ICD-10-CM | POA: Diagnosis not present

## 2023-07-31 DIAGNOSIS — R112 Nausea with vomiting, unspecified: Secondary | ICD-10-CM | POA: Diagnosis not present

## 2023-07-31 DIAGNOSIS — Z79899 Other long term (current) drug therapy: Secondary | ICD-10-CM | POA: Diagnosis not present

## 2023-07-31 DIAGNOSIS — Z5111 Encounter for antineoplastic chemotherapy: Secondary | ICD-10-CM | POA: Diagnosis not present

## 2023-07-31 DIAGNOSIS — Z17410 Hormone receptor positive with human epidermal growth factor receptor 2 positive status: Secondary | ICD-10-CM | POA: Diagnosis not present

## 2023-07-31 DIAGNOSIS — Z170 Estrogen receptor positive status [ER+]: Secondary | ICD-10-CM | POA: Diagnosis not present

## 2023-07-31 DIAGNOSIS — C50411 Malignant neoplasm of upper-outer quadrant of right female breast: Secondary | ICD-10-CM | POA: Diagnosis not present

## 2023-07-31 DIAGNOSIS — T451X5A Adverse effect of antineoplastic and immunosuppressive drugs, initial encounter: Secondary | ICD-10-CM | POA: Diagnosis not present

## 2023-08-12 DIAGNOSIS — R509 Fever, unspecified: Secondary | ICD-10-CM | POA: Diagnosis not present

## 2023-08-12 DIAGNOSIS — C50411 Malignant neoplasm of upper-outer quadrant of right female breast: Secondary | ICD-10-CM | POA: Diagnosis not present

## 2023-08-21 DIAGNOSIS — Z1721 Progesterone receptor positive status: Secondary | ICD-10-CM | POA: Diagnosis not present

## 2023-08-21 DIAGNOSIS — Z79899 Other long term (current) drug therapy: Secondary | ICD-10-CM | POA: Diagnosis not present

## 2023-08-21 DIAGNOSIS — Z5112 Encounter for antineoplastic immunotherapy: Secondary | ICD-10-CM | POA: Diagnosis not present

## 2023-08-21 DIAGNOSIS — Z170 Estrogen receptor positive status [ER+]: Secondary | ICD-10-CM | POA: Diagnosis not present

## 2023-08-21 DIAGNOSIS — Z5111 Encounter for antineoplastic chemotherapy: Secondary | ICD-10-CM | POA: Diagnosis not present

## 2023-08-21 DIAGNOSIS — C50411 Malignant neoplasm of upper-outer quadrant of right female breast: Secondary | ICD-10-CM | POA: Diagnosis not present

## 2023-08-21 DIAGNOSIS — R5383 Other fatigue: Secondary | ICD-10-CM | POA: Diagnosis not present

## 2023-09-10 DIAGNOSIS — R530 Neoplastic (malignant) related fatigue: Secondary | ICD-10-CM | POA: Diagnosis not present

## 2023-09-10 DIAGNOSIS — N6489 Other specified disorders of breast: Secondary | ICD-10-CM | POA: Diagnosis not present

## 2023-09-10 DIAGNOSIS — N6021 Fibroadenosis of right breast: Secondary | ICD-10-CM | POA: Diagnosis not present

## 2023-09-10 DIAGNOSIS — Z1721 Progesterone receptor positive status: Secondary | ICD-10-CM | POA: Diagnosis not present

## 2023-09-10 DIAGNOSIS — Z5111 Encounter for antineoplastic chemotherapy: Secondary | ICD-10-CM | POA: Diagnosis not present

## 2023-09-10 DIAGNOSIS — Z1731 Human epidermal growth factor receptor 2 positive status: Secondary | ICD-10-CM | POA: Diagnosis not present

## 2023-09-10 DIAGNOSIS — R55 Syncope and collapse: Secondary | ICD-10-CM | POA: Diagnosis not present

## 2023-09-10 DIAGNOSIS — D6959 Other secondary thrombocytopenia: Secondary | ICD-10-CM | POA: Diagnosis not present

## 2023-09-10 DIAGNOSIS — R160 Hepatomegaly, not elsewhere classified: Secondary | ICD-10-CM | POA: Diagnosis not present

## 2023-09-10 DIAGNOSIS — R5383 Other fatigue: Secondary | ICD-10-CM | POA: Diagnosis not present

## 2023-09-10 DIAGNOSIS — C50411 Malignant neoplasm of upper-outer quadrant of right female breast: Secondary | ICD-10-CM | POA: Diagnosis not present

## 2023-09-10 DIAGNOSIS — D649 Anemia, unspecified: Secondary | ICD-10-CM | POA: Diagnosis not present

## 2023-09-10 DIAGNOSIS — N6011 Diffuse cystic mastopathy of right breast: Secondary | ICD-10-CM | POA: Diagnosis not present

## 2023-09-10 DIAGNOSIS — Z170 Estrogen receptor positive status [ER+]: Secondary | ICD-10-CM | POA: Diagnosis not present

## 2023-09-10 DIAGNOSIS — Z79899 Other long term (current) drug therapy: Secondary | ICD-10-CM | POA: Diagnosis not present

## 2023-09-10 DIAGNOSIS — M898X9 Other specified disorders of bone, unspecified site: Secondary | ICD-10-CM | POA: Diagnosis not present

## 2023-09-10 DIAGNOSIS — T50905A Adverse effect of unspecified drugs, medicaments and biological substances, initial encounter: Secondary | ICD-10-CM | POA: Diagnosis not present

## 2023-09-17 DIAGNOSIS — C50411 Malignant neoplasm of upper-outer quadrant of right female breast: Secondary | ICD-10-CM | POA: Diagnosis not present

## 2023-09-17 DIAGNOSIS — Z5112 Encounter for antineoplastic immunotherapy: Secondary | ICD-10-CM | POA: Diagnosis not present

## 2023-09-17 DIAGNOSIS — Z5111 Encounter for antineoplastic chemotherapy: Secondary | ICD-10-CM | POA: Diagnosis not present

## 2023-09-17 DIAGNOSIS — Z79899 Other long term (current) drug therapy: Secondary | ICD-10-CM | POA: Diagnosis not present

## 2023-09-17 DIAGNOSIS — Z170 Estrogen receptor positive status [ER+]: Secondary | ICD-10-CM | POA: Diagnosis not present

## 2023-10-06 DIAGNOSIS — R530 Neoplastic (malignant) related fatigue: Secondary | ICD-10-CM | POA: Diagnosis not present

## 2023-10-06 DIAGNOSIS — D6959 Other secondary thrombocytopenia: Secondary | ICD-10-CM | POA: Diagnosis not present

## 2023-10-06 DIAGNOSIS — Z170 Estrogen receptor positive status [ER+]: Secondary | ICD-10-CM | POA: Diagnosis not present

## 2023-10-06 DIAGNOSIS — Z9011 Acquired absence of right breast and nipple: Secondary | ICD-10-CM | POA: Diagnosis not present

## 2023-10-06 DIAGNOSIS — D6481 Anemia due to antineoplastic chemotherapy: Secondary | ICD-10-CM | POA: Diagnosis not present

## 2023-10-06 DIAGNOSIS — D701 Agranulocytosis secondary to cancer chemotherapy: Secondary | ICD-10-CM | POA: Diagnosis not present

## 2023-10-06 DIAGNOSIS — Z5111 Encounter for antineoplastic chemotherapy: Secondary | ICD-10-CM | POA: Diagnosis not present

## 2023-10-06 DIAGNOSIS — C50411 Malignant neoplasm of upper-outer quadrant of right female breast: Secondary | ICD-10-CM | POA: Diagnosis not present

## 2023-10-06 DIAGNOSIS — T451X5D Adverse effect of antineoplastic and immunosuppressive drugs, subsequent encounter: Secondary | ICD-10-CM | POA: Diagnosis not present

## 2023-10-06 DIAGNOSIS — R197 Diarrhea, unspecified: Secondary | ICD-10-CM | POA: Diagnosis not present

## 2023-10-06 DIAGNOSIS — Z5112 Encounter for antineoplastic immunotherapy: Secondary | ICD-10-CM | POA: Diagnosis not present

## 2023-10-13 DIAGNOSIS — Z79899 Other long term (current) drug therapy: Secondary | ICD-10-CM | POA: Diagnosis not present

## 2023-10-13 DIAGNOSIS — Z170 Estrogen receptor positive status [ER+]: Secondary | ICD-10-CM | POA: Diagnosis not present

## 2023-10-13 DIAGNOSIS — R530 Neoplastic (malignant) related fatigue: Secondary | ICD-10-CM | POA: Diagnosis not present

## 2023-10-13 DIAGNOSIS — Z5181 Encounter for therapeutic drug level monitoring: Secondary | ICD-10-CM | POA: Diagnosis not present

## 2023-10-13 DIAGNOSIS — C50411 Malignant neoplasm of upper-outer quadrant of right female breast: Secondary | ICD-10-CM | POA: Diagnosis not present

## 2023-10-28 DIAGNOSIS — T451X5A Adverse effect of antineoplastic and immunosuppressive drugs, initial encounter: Secondary | ICD-10-CM | POA: Diagnosis not present

## 2023-10-28 DIAGNOSIS — D649 Anemia, unspecified: Secondary | ICD-10-CM | POA: Diagnosis not present

## 2023-10-28 DIAGNOSIS — Z5111 Encounter for antineoplastic chemotherapy: Secondary | ICD-10-CM | POA: Diagnosis not present

## 2023-10-28 DIAGNOSIS — D6959 Other secondary thrombocytopenia: Secondary | ICD-10-CM | POA: Diagnosis not present

## 2023-10-28 DIAGNOSIS — C50411 Malignant neoplasm of upper-outer quadrant of right female breast: Secondary | ICD-10-CM | POA: Diagnosis not present

## 2023-10-28 DIAGNOSIS — Z1721 Progesterone receptor positive status: Secondary | ICD-10-CM | POA: Diagnosis not present

## 2023-10-28 DIAGNOSIS — Z5112 Encounter for antineoplastic immunotherapy: Secondary | ICD-10-CM | POA: Diagnosis not present

## 2023-10-28 DIAGNOSIS — Z170 Estrogen receptor positive status [ER+]: Secondary | ICD-10-CM | POA: Diagnosis not present

## 2023-10-28 DIAGNOSIS — D709 Neutropenia, unspecified: Secondary | ICD-10-CM | POA: Diagnosis not present

## 2023-10-28 DIAGNOSIS — D630 Anemia in neoplastic disease: Secondary | ICD-10-CM | POA: Diagnosis not present

## 2023-10-28 DIAGNOSIS — C50811 Malignant neoplasm of overlapping sites of right female breast: Secondary | ICD-10-CM | POA: Diagnosis not present

## 2023-10-28 DIAGNOSIS — R197 Diarrhea, unspecified: Secondary | ICD-10-CM | POA: Diagnosis not present

## 2023-10-28 DIAGNOSIS — Z79899 Other long term (current) drug therapy: Secondary | ICD-10-CM | POA: Diagnosis not present

## 2023-10-28 DIAGNOSIS — Z1731 Human epidermal growth factor receptor 2 positive status: Secondary | ICD-10-CM | POA: Diagnosis not present

## 2023-10-28 DIAGNOSIS — R530 Neoplastic (malignant) related fatigue: Secondary | ICD-10-CM | POA: Diagnosis not present

## 2023-11-09 DIAGNOSIS — C50811 Malignant neoplasm of overlapping sites of right female breast: Secondary | ICD-10-CM | POA: Diagnosis not present

## 2023-11-09 DIAGNOSIS — Z170 Estrogen receptor positive status [ER+]: Secondary | ICD-10-CM | POA: Diagnosis not present

## 2023-11-09 DIAGNOSIS — C50411 Malignant neoplasm of upper-outer quadrant of right female breast: Secondary | ICD-10-CM | POA: Diagnosis not present

## 2023-11-11 DIAGNOSIS — C50411 Malignant neoplasm of upper-outer quadrant of right female breast: Secondary | ICD-10-CM | POA: Diagnosis not present

## 2023-11-11 DIAGNOSIS — Z170 Estrogen receptor positive status [ER+]: Secondary | ICD-10-CM | POA: Diagnosis not present

## 2023-11-18 DIAGNOSIS — G620 Drug-induced polyneuropathy: Secondary | ICD-10-CM | POA: Diagnosis not present

## 2023-11-18 DIAGNOSIS — C50411 Malignant neoplasm of upper-outer quadrant of right female breast: Secondary | ICD-10-CM | POA: Diagnosis not present

## 2023-11-18 DIAGNOSIS — T451X5A Adverse effect of antineoplastic and immunosuppressive drugs, initial encounter: Secondary | ICD-10-CM | POA: Diagnosis not present

## 2023-11-18 DIAGNOSIS — C50811 Malignant neoplasm of overlapping sites of right female breast: Secondary | ICD-10-CM | POA: Diagnosis not present

## 2023-11-18 DIAGNOSIS — Z5112 Encounter for antineoplastic immunotherapy: Secondary | ICD-10-CM | POA: Diagnosis not present

## 2023-11-18 DIAGNOSIS — Z1731 Human epidermal growth factor receptor 2 positive status: Secondary | ICD-10-CM | POA: Diagnosis not present

## 2023-11-18 DIAGNOSIS — Z79899 Other long term (current) drug therapy: Secondary | ICD-10-CM | POA: Diagnosis not present

## 2023-11-18 DIAGNOSIS — K7689 Other specified diseases of liver: Secondary | ICD-10-CM | POA: Diagnosis not present

## 2023-11-18 DIAGNOSIS — Z170 Estrogen receptor positive status [ER+]: Secondary | ICD-10-CM | POA: Diagnosis not present

## 2023-11-18 DIAGNOSIS — Z5111 Encounter for antineoplastic chemotherapy: Secondary | ICD-10-CM | POA: Diagnosis not present

## 2023-11-18 DIAGNOSIS — Z1721 Progesterone receptor positive status: Secondary | ICD-10-CM | POA: Diagnosis not present

## 2023-11-20 DIAGNOSIS — C50811 Malignant neoplasm of overlapping sites of right female breast: Secondary | ICD-10-CM | POA: Diagnosis not present

## 2023-11-23 DIAGNOSIS — C50811 Malignant neoplasm of overlapping sites of right female breast: Secondary | ICD-10-CM | POA: Diagnosis not present

## 2023-11-24 DIAGNOSIS — C50811 Malignant neoplasm of overlapping sites of right female breast: Secondary | ICD-10-CM | POA: Diagnosis not present

## 2023-11-25 DIAGNOSIS — C50811 Malignant neoplasm of overlapping sites of right female breast: Secondary | ICD-10-CM | POA: Diagnosis not present

## 2023-11-26 DIAGNOSIS — C50811 Malignant neoplasm of overlapping sites of right female breast: Secondary | ICD-10-CM | POA: Diagnosis not present

## 2023-11-27 DIAGNOSIS — C50811 Malignant neoplasm of overlapping sites of right female breast: Secondary | ICD-10-CM | POA: Diagnosis not present

## 2023-11-30 DIAGNOSIS — C50811 Malignant neoplasm of overlapping sites of right female breast: Secondary | ICD-10-CM | POA: Diagnosis not present

## 2023-12-01 DIAGNOSIS — C50811 Malignant neoplasm of overlapping sites of right female breast: Secondary | ICD-10-CM | POA: Diagnosis not present

## 2023-12-02 DIAGNOSIS — C50811 Malignant neoplasm of overlapping sites of right female breast: Secondary | ICD-10-CM | POA: Diagnosis not present

## 2023-12-03 DIAGNOSIS — C50811 Malignant neoplasm of overlapping sites of right female breast: Secondary | ICD-10-CM | POA: Diagnosis not present

## 2023-12-04 DIAGNOSIS — C50811 Malignant neoplasm of overlapping sites of right female breast: Secondary | ICD-10-CM | POA: Diagnosis not present

## 2023-12-07 DIAGNOSIS — C50811 Malignant neoplasm of overlapping sites of right female breast: Secondary | ICD-10-CM | POA: Diagnosis not present

## 2023-12-08 DIAGNOSIS — C50811 Malignant neoplasm of overlapping sites of right female breast: Secondary | ICD-10-CM | POA: Diagnosis not present

## 2023-12-09 DIAGNOSIS — Z170 Estrogen receptor positive status [ER+]: Secondary | ICD-10-CM | POA: Diagnosis not present

## 2023-12-09 DIAGNOSIS — C50811 Malignant neoplasm of overlapping sites of right female breast: Secondary | ICD-10-CM | POA: Diagnosis not present

## 2023-12-09 DIAGNOSIS — Z5112 Encounter for antineoplastic immunotherapy: Secondary | ICD-10-CM | POA: Diagnosis not present

## 2023-12-09 DIAGNOSIS — T451X5A Adverse effect of antineoplastic and immunosuppressive drugs, initial encounter: Secondary | ICD-10-CM | POA: Diagnosis not present

## 2023-12-09 DIAGNOSIS — C779 Secondary and unspecified malignant neoplasm of lymph node, unspecified: Secondary | ICD-10-CM | POA: Diagnosis not present

## 2023-12-09 DIAGNOSIS — Z79899 Other long term (current) drug therapy: Secondary | ICD-10-CM | POA: Diagnosis not present

## 2023-12-09 DIAGNOSIS — G620 Drug-induced polyneuropathy: Secondary | ICD-10-CM | POA: Diagnosis not present

## 2023-12-09 DIAGNOSIS — Z5111 Encounter for antineoplastic chemotherapy: Secondary | ICD-10-CM | POA: Diagnosis not present

## 2023-12-09 DIAGNOSIS — Z5181 Encounter for therapeutic drug level monitoring: Secondary | ICD-10-CM | POA: Diagnosis not present

## 2023-12-09 DIAGNOSIS — Z1721 Progesterone receptor positive status: Secondary | ICD-10-CM | POA: Diagnosis not present

## 2023-12-09 DIAGNOSIS — C50411 Malignant neoplasm of upper-outer quadrant of right female breast: Secondary | ICD-10-CM | POA: Diagnosis not present

## 2023-12-10 DIAGNOSIS — C50811 Malignant neoplasm of overlapping sites of right female breast: Secondary | ICD-10-CM | POA: Diagnosis not present

## 2023-12-11 DIAGNOSIS — C50811 Malignant neoplasm of overlapping sites of right female breast: Secondary | ICD-10-CM | POA: Diagnosis not present

## 2023-12-14 DIAGNOSIS — C50811 Malignant neoplasm of overlapping sites of right female breast: Secondary | ICD-10-CM | POA: Diagnosis not present

## 2023-12-15 DIAGNOSIS — C50811 Malignant neoplasm of overlapping sites of right female breast: Secondary | ICD-10-CM | POA: Diagnosis not present

## 2023-12-16 DIAGNOSIS — C50811 Malignant neoplasm of overlapping sites of right female breast: Secondary | ICD-10-CM | POA: Diagnosis not present

## 2023-12-17 DIAGNOSIS — C50811 Malignant neoplasm of overlapping sites of right female breast: Secondary | ICD-10-CM | POA: Diagnosis not present

## 2023-12-18 DIAGNOSIS — C50811 Malignant neoplasm of overlapping sites of right female breast: Secondary | ICD-10-CM | POA: Diagnosis not present

## 2023-12-21 DIAGNOSIS — C50811 Malignant neoplasm of overlapping sites of right female breast: Secondary | ICD-10-CM | POA: Diagnosis not present

## 2023-12-21 DIAGNOSIS — C50411 Malignant neoplasm of upper-outer quadrant of right female breast: Secondary | ICD-10-CM | POA: Diagnosis not present

## 2023-12-21 DIAGNOSIS — Z170 Estrogen receptor positive status [ER+]: Secondary | ICD-10-CM | POA: Diagnosis not present

## 2024-01-04 DIAGNOSIS — C50411 Malignant neoplasm of upper-outer quadrant of right female breast: Secondary | ICD-10-CM | POA: Diagnosis not present

## 2024-01-04 DIAGNOSIS — T451X5A Adverse effect of antineoplastic and immunosuppressive drugs, initial encounter: Secondary | ICD-10-CM | POA: Diagnosis not present

## 2024-01-04 DIAGNOSIS — Z5112 Encounter for antineoplastic immunotherapy: Secondary | ICD-10-CM | POA: Diagnosis not present

## 2024-01-04 DIAGNOSIS — Z79899 Other long term (current) drug therapy: Secondary | ICD-10-CM | POA: Diagnosis not present

## 2024-01-04 DIAGNOSIS — G620 Drug-induced polyneuropathy: Secondary | ICD-10-CM | POA: Diagnosis not present

## 2024-01-04 DIAGNOSIS — Z170 Estrogen receptor positive status [ER+]: Secondary | ICD-10-CM | POA: Diagnosis not present

## 2024-01-04 DIAGNOSIS — Z923 Personal history of irradiation: Secondary | ICD-10-CM | POA: Diagnosis not present

## 2024-01-04 DIAGNOSIS — Z79620 Long term (current) use of immunosuppressive biologic: Secondary | ICD-10-CM | POA: Diagnosis not present

## 2024-01-19 DIAGNOSIS — Z5181 Encounter for therapeutic drug level monitoring: Secondary | ICD-10-CM | POA: Diagnosis not present

## 2024-01-19 DIAGNOSIS — Z79899 Other long term (current) drug therapy: Secondary | ICD-10-CM | POA: Diagnosis not present

## 2024-01-25 DIAGNOSIS — Z923 Personal history of irradiation: Secondary | ICD-10-CM | POA: Diagnosis not present

## 2024-01-25 DIAGNOSIS — C50411 Malignant neoplasm of upper-outer quadrant of right female breast: Secondary | ICD-10-CM | POA: Diagnosis not present

## 2024-01-25 DIAGNOSIS — K521 Toxic gastroenteritis and colitis: Secondary | ICD-10-CM | POA: Diagnosis not present

## 2024-01-25 DIAGNOSIS — Z1721 Progesterone receptor positive status: Secondary | ICD-10-CM | POA: Diagnosis not present

## 2024-01-25 DIAGNOSIS — Z1731 Human epidermal growth factor receptor 2 positive status: Secondary | ICD-10-CM | POA: Diagnosis not present

## 2024-01-25 DIAGNOSIS — Z79811 Long term (current) use of aromatase inhibitors: Secondary | ICD-10-CM | POA: Diagnosis not present

## 2024-01-25 DIAGNOSIS — Z5181 Encounter for therapeutic drug level monitoring: Secondary | ICD-10-CM | POA: Diagnosis not present

## 2024-01-25 DIAGNOSIS — Z5111 Encounter for antineoplastic chemotherapy: Secondary | ICD-10-CM | POA: Diagnosis not present

## 2024-01-25 DIAGNOSIS — Z79899 Other long term (current) drug therapy: Secondary | ICD-10-CM | POA: Diagnosis not present

## 2024-01-25 DIAGNOSIS — Z5112 Encounter for antineoplastic immunotherapy: Secondary | ICD-10-CM | POA: Diagnosis not present

## 2024-01-25 DIAGNOSIS — R232 Flushing: Secondary | ICD-10-CM | POA: Diagnosis not present

## 2024-01-25 DIAGNOSIS — Z170 Estrogen receptor positive status [ER+]: Secondary | ICD-10-CM | POA: Diagnosis not present

## 2024-01-25 DIAGNOSIS — Z800 Family history of malignant neoplasm of digestive organs: Secondary | ICD-10-CM | POA: Diagnosis not present

## 2024-01-25 DIAGNOSIS — D630 Anemia in neoplastic disease: Secondary | ICD-10-CM | POA: Diagnosis not present

## 2024-01-25 DIAGNOSIS — D649 Anemia, unspecified: Secondary | ICD-10-CM | POA: Diagnosis not present

## 2024-02-15 DIAGNOSIS — Z5181 Encounter for therapeutic drug level monitoring: Secondary | ICD-10-CM | POA: Diagnosis not present

## 2024-02-15 DIAGNOSIS — C50411 Malignant neoplasm of upper-outer quadrant of right female breast: Secondary | ICD-10-CM | POA: Diagnosis not present

## 2024-02-15 DIAGNOSIS — Z170 Estrogen receptor positive status [ER+]: Secondary | ICD-10-CM | POA: Diagnosis not present

## 2024-02-15 DIAGNOSIS — Z79899 Other long term (current) drug therapy: Secondary | ICD-10-CM | POA: Diagnosis not present

## 2024-02-15 DIAGNOSIS — Z79811 Long term (current) use of aromatase inhibitors: Secondary | ICD-10-CM | POA: Diagnosis not present

## 2024-02-15 DIAGNOSIS — Z5112 Encounter for antineoplastic immunotherapy: Secondary | ICD-10-CM | POA: Diagnosis not present

## 2024-03-07 DIAGNOSIS — Z8042 Family history of malignant neoplasm of prostate: Secondary | ICD-10-CM | POA: Diagnosis not present

## 2024-03-07 DIAGNOSIS — Z5112 Encounter for antineoplastic immunotherapy: Secondary | ICD-10-CM | POA: Diagnosis not present

## 2024-03-07 DIAGNOSIS — Z79899 Other long term (current) drug therapy: Secondary | ICD-10-CM | POA: Diagnosis not present

## 2024-03-07 DIAGNOSIS — Z5181 Encounter for therapeutic drug level monitoring: Secondary | ICD-10-CM | POA: Diagnosis not present

## 2024-03-07 DIAGNOSIS — Z923 Personal history of irradiation: Secondary | ICD-10-CM | POA: Diagnosis not present

## 2024-03-07 DIAGNOSIS — Z5111 Encounter for antineoplastic chemotherapy: Secondary | ICD-10-CM | POA: Diagnosis not present

## 2024-03-07 DIAGNOSIS — Z79818 Long term (current) use of other agents affecting estrogen receptors and estrogen levels: Secondary | ICD-10-CM | POA: Diagnosis not present

## 2024-03-07 DIAGNOSIS — R202 Paresthesia of skin: Secondary | ICD-10-CM | POA: Diagnosis not present

## 2024-03-07 DIAGNOSIS — Z79811 Long term (current) use of aromatase inhibitors: Secondary | ICD-10-CM | POA: Diagnosis not present

## 2024-03-07 DIAGNOSIS — R5383 Other fatigue: Secondary | ICD-10-CM | POA: Diagnosis not present

## 2024-03-07 DIAGNOSIS — Z9189 Other specified personal risk factors, not elsewhere classified: Secondary | ICD-10-CM | POA: Diagnosis not present

## 2024-03-07 DIAGNOSIS — R197 Diarrhea, unspecified: Secondary | ICD-10-CM | POA: Diagnosis not present

## 2024-03-07 DIAGNOSIS — Z170 Estrogen receptor positive status [ER+]: Secondary | ICD-10-CM | POA: Diagnosis not present

## 2024-03-07 DIAGNOSIS — Z800 Family history of malignant neoplasm of digestive organs: Secondary | ICD-10-CM | POA: Diagnosis not present

## 2024-03-07 DIAGNOSIS — Z1721 Progesterone receptor positive status: Secondary | ICD-10-CM | POA: Diagnosis not present

## 2024-03-07 DIAGNOSIS — F32A Depression, unspecified: Secondary | ICD-10-CM | POA: Diagnosis not present

## 2024-03-07 DIAGNOSIS — Z1731 Human epidermal growth factor receptor 2 positive status: Secondary | ICD-10-CM | POA: Diagnosis not present

## 2024-03-07 DIAGNOSIS — C50411 Malignant neoplasm of upper-outer quadrant of right female breast: Secondary | ICD-10-CM | POA: Diagnosis not present

## 2024-03-28 DIAGNOSIS — Z1731 Human epidermal growth factor receptor 2 positive status: Secondary | ICD-10-CM | POA: Diagnosis not present

## 2024-03-28 DIAGNOSIS — Z923 Personal history of irradiation: Secondary | ICD-10-CM | POA: Diagnosis not present

## 2024-03-28 DIAGNOSIS — Z170 Estrogen receptor positive status [ER+]: Secondary | ICD-10-CM | POA: Diagnosis not present

## 2024-03-28 DIAGNOSIS — T451X5D Adverse effect of antineoplastic and immunosuppressive drugs, subsequent encounter: Secondary | ICD-10-CM | POA: Diagnosis not present

## 2024-03-28 DIAGNOSIS — Z5181 Encounter for therapeutic drug level monitoring: Secondary | ICD-10-CM | POA: Diagnosis not present

## 2024-03-28 DIAGNOSIS — C50411 Malignant neoplasm of upper-outer quadrant of right female breast: Secondary | ICD-10-CM | POA: Diagnosis not present

## 2024-03-28 DIAGNOSIS — C50811 Malignant neoplasm of overlapping sites of right female breast: Secondary | ICD-10-CM | POA: Diagnosis not present

## 2024-03-28 DIAGNOSIS — Z5112 Encounter for antineoplastic immunotherapy: Secondary | ICD-10-CM | POA: Diagnosis not present

## 2024-03-28 DIAGNOSIS — Z1721 Progesterone receptor positive status: Secondary | ICD-10-CM | POA: Diagnosis not present

## 2024-03-28 DIAGNOSIS — K521 Toxic gastroenteritis and colitis: Secondary | ICD-10-CM | POA: Diagnosis not present

## 2024-03-28 DIAGNOSIS — D72819 Decreased white blood cell count, unspecified: Secondary | ICD-10-CM | POA: Diagnosis not present

## 2024-03-28 DIAGNOSIS — Z5111 Encounter for antineoplastic chemotherapy: Secondary | ICD-10-CM | POA: Diagnosis not present

## 2024-03-28 DIAGNOSIS — Z79811 Long term (current) use of aromatase inhibitors: Secondary | ICD-10-CM | POA: Diagnosis not present

## 2024-04-15 DIAGNOSIS — C50411 Malignant neoplasm of upper-outer quadrant of right female breast: Secondary | ICD-10-CM | POA: Diagnosis not present

## 2024-04-15 DIAGNOSIS — R921 Mammographic calcification found on diagnostic imaging of breast: Secondary | ICD-10-CM | POA: Diagnosis not present

## 2024-04-15 DIAGNOSIS — Z170 Estrogen receptor positive status [ER+]: Secondary | ICD-10-CM | POA: Diagnosis not present

## 2024-04-15 DIAGNOSIS — Z9889 Other specified postprocedural states: Secondary | ICD-10-CM | POA: Diagnosis not present

## 2024-04-15 DIAGNOSIS — Z853 Personal history of malignant neoplasm of breast: Secondary | ICD-10-CM | POA: Diagnosis not present

## 2024-04-27 DIAGNOSIS — Z170 Estrogen receptor positive status [ER+]: Secondary | ICD-10-CM | POA: Diagnosis not present

## 2024-04-27 DIAGNOSIS — R921 Mammographic calcification found on diagnostic imaging of breast: Secondary | ICD-10-CM | POA: Diagnosis not present

## 2024-04-27 DIAGNOSIS — Z923 Personal history of irradiation: Secondary | ICD-10-CM | POA: Diagnosis not present

## 2024-04-27 DIAGNOSIS — Z5181 Encounter for therapeutic drug level monitoring: Secondary | ICD-10-CM | POA: Diagnosis not present

## 2024-04-27 DIAGNOSIS — D701 Agranulocytosis secondary to cancer chemotherapy: Secondary | ICD-10-CM | POA: Diagnosis not present

## 2024-04-27 DIAGNOSIS — C50411 Malignant neoplasm of upper-outer quadrant of right female breast: Secondary | ICD-10-CM | POA: Diagnosis not present

## 2024-04-27 DIAGNOSIS — C50811 Malignant neoplasm of overlapping sites of right female breast: Secondary | ICD-10-CM | POA: Diagnosis not present

## 2024-04-27 DIAGNOSIS — Z9011 Acquired absence of right breast and nipple: Secondary | ICD-10-CM | POA: Diagnosis not present

## 2024-04-27 DIAGNOSIS — D242 Benign neoplasm of left breast: Secondary | ICD-10-CM | POA: Diagnosis not present

## 2024-04-27 DIAGNOSIS — Z79899 Other long term (current) drug therapy: Secondary | ICD-10-CM | POA: Diagnosis not present

## 2024-04-27 DIAGNOSIS — R92342 Mammographic extreme density, left breast: Secondary | ICD-10-CM | POA: Diagnosis not present

## 2024-04-27 DIAGNOSIS — Z79811 Long term (current) use of aromatase inhibitors: Secondary | ICD-10-CM | POA: Diagnosis not present

## 2024-04-27 DIAGNOSIS — Z5111 Encounter for antineoplastic chemotherapy: Secondary | ICD-10-CM | POA: Diagnosis not present

## 2024-04-27 DIAGNOSIS — T451X5D Adverse effect of antineoplastic and immunosuppressive drugs, subsequent encounter: Secondary | ICD-10-CM | POA: Diagnosis not present

## 2024-04-27 DIAGNOSIS — Z5112 Encounter for antineoplastic immunotherapy: Secondary | ICD-10-CM | POA: Diagnosis not present

## 2024-05-03 DIAGNOSIS — Z170 Estrogen receptor positive status [ER+]: Secondary | ICD-10-CM | POA: Diagnosis not present

## 2024-05-03 DIAGNOSIS — C50411 Malignant neoplasm of upper-outer quadrant of right female breast: Secondary | ICD-10-CM | POA: Diagnosis not present

## 2024-05-09 DIAGNOSIS — Z5181 Encounter for therapeutic drug level monitoring: Secondary | ICD-10-CM | POA: Diagnosis not present

## 2024-05-09 DIAGNOSIS — C50411 Malignant neoplasm of upper-outer quadrant of right female breast: Secondary | ICD-10-CM | POA: Diagnosis not present

## 2024-05-09 DIAGNOSIS — Z79899 Other long term (current) drug therapy: Secondary | ICD-10-CM | POA: Diagnosis not present

## 2024-05-09 DIAGNOSIS — Z170 Estrogen receptor positive status [ER+]: Secondary | ICD-10-CM | POA: Diagnosis not present

## 2024-05-18 DIAGNOSIS — Z79899 Other long term (current) drug therapy: Secondary | ICD-10-CM | POA: Diagnosis not present

## 2024-05-18 DIAGNOSIS — Z5111 Encounter for antineoplastic chemotherapy: Secondary | ICD-10-CM | POA: Diagnosis not present

## 2024-05-18 DIAGNOSIS — C50411 Malignant neoplasm of upper-outer quadrant of right female breast: Secondary | ICD-10-CM | POA: Diagnosis not present

## 2024-05-18 DIAGNOSIS — Z5112 Encounter for antineoplastic immunotherapy: Secondary | ICD-10-CM | POA: Diagnosis not present

## 2024-05-18 DIAGNOSIS — Z170 Estrogen receptor positive status [ER+]: Secondary | ICD-10-CM | POA: Diagnosis not present

## 2024-05-23 DIAGNOSIS — Z01419 Encounter for gynecological examination (general) (routine) without abnormal findings: Secondary | ICD-10-CM | POA: Diagnosis not present

## 2024-05-23 DIAGNOSIS — Z124 Encounter for screening for malignant neoplasm of cervix: Secondary | ICD-10-CM | POA: Diagnosis not present

## 2024-05-23 DIAGNOSIS — Z6820 Body mass index (BMI) 20.0-20.9, adult: Secondary | ICD-10-CM | POA: Diagnosis not present

## 2024-06-08 DIAGNOSIS — Z923 Personal history of irradiation: Secondary | ICD-10-CM | POA: Diagnosis not present

## 2024-06-08 DIAGNOSIS — Z5181 Encounter for therapeutic drug level monitoring: Secondary | ICD-10-CM | POA: Diagnosis not present

## 2024-06-08 DIAGNOSIS — Z79811 Long term (current) use of aromatase inhibitors: Secondary | ICD-10-CM | POA: Diagnosis not present

## 2024-06-08 DIAGNOSIS — Z5111 Encounter for antineoplastic chemotherapy: Secondary | ICD-10-CM | POA: Diagnosis not present

## 2024-06-08 DIAGNOSIS — T451X5A Adverse effect of antineoplastic and immunosuppressive drugs, initial encounter: Secondary | ICD-10-CM | POA: Diagnosis not present

## 2024-06-08 DIAGNOSIS — Z79899 Other long term (current) drug therapy: Secondary | ICD-10-CM | POA: Diagnosis not present

## 2024-06-08 DIAGNOSIS — Z5112 Encounter for antineoplastic immunotherapy: Secondary | ICD-10-CM | POA: Diagnosis not present

## 2024-06-08 DIAGNOSIS — Z79620 Long term (current) use of immunosuppressive biologic: Secondary | ICD-10-CM | POA: Diagnosis not present

## 2024-06-08 DIAGNOSIS — Z79818 Long term (current) use of other agents affecting estrogen receptors and estrogen levels: Secondary | ICD-10-CM | POA: Diagnosis not present

## 2024-06-08 DIAGNOSIS — N644 Mastodynia: Secondary | ICD-10-CM | POA: Diagnosis not present

## 2024-06-08 DIAGNOSIS — C50411 Malignant neoplasm of upper-outer quadrant of right female breast: Secondary | ICD-10-CM | POA: Diagnosis not present

## 2024-06-08 DIAGNOSIS — D701 Agranulocytosis secondary to cancer chemotherapy: Secondary | ICD-10-CM | POA: Diagnosis not present

## 2024-06-08 DIAGNOSIS — G620 Drug-induced polyneuropathy: Secondary | ICD-10-CM | POA: Diagnosis not present

## 2024-06-08 DIAGNOSIS — Z170 Estrogen receptor positive status [ER+]: Secondary | ICD-10-CM | POA: Diagnosis not present

## 2024-06-20 DIAGNOSIS — Z9189 Other specified personal risk factors, not elsewhere classified: Secondary | ICD-10-CM | POA: Diagnosis not present

## 2024-06-20 DIAGNOSIS — Z780 Asymptomatic menopausal state: Secondary | ICD-10-CM | POA: Diagnosis not present

## 2024-06-20 DIAGNOSIS — Z79811 Long term (current) use of aromatase inhibitors: Secondary | ICD-10-CM | POA: Diagnosis not present

## 2024-06-20 DIAGNOSIS — C50411 Malignant neoplasm of upper-outer quadrant of right female breast: Secondary | ICD-10-CM | POA: Diagnosis not present

## 2024-06-20 DIAGNOSIS — Z170 Estrogen receptor positive status [ER+]: Secondary | ICD-10-CM | POA: Diagnosis not present

## 2024-06-20 DIAGNOSIS — Z5181 Encounter for therapeutic drug level monitoring: Secondary | ICD-10-CM | POA: Diagnosis not present

## 2024-06-20 DIAGNOSIS — C50919 Malignant neoplasm of unspecified site of unspecified female breast: Secondary | ICD-10-CM | POA: Diagnosis not present

## 2024-06-27 DIAGNOSIS — E785 Hyperlipidemia, unspecified: Secondary | ICD-10-CM | POA: Diagnosis not present

## 2024-06-27 DIAGNOSIS — R946 Abnormal results of thyroid function studies: Secondary | ICD-10-CM | POA: Diagnosis not present

## 2024-06-29 DIAGNOSIS — Z5181 Encounter for therapeutic drug level monitoring: Secondary | ICD-10-CM | POA: Diagnosis not present

## 2024-06-29 DIAGNOSIS — G620 Drug-induced polyneuropathy: Secondary | ICD-10-CM | POA: Diagnosis not present

## 2024-06-29 DIAGNOSIS — Z5112 Encounter for antineoplastic immunotherapy: Secondary | ICD-10-CM | POA: Diagnosis not present

## 2024-06-29 DIAGNOSIS — D701 Agranulocytosis secondary to cancer chemotherapy: Secondary | ICD-10-CM | POA: Diagnosis not present

## 2024-06-29 DIAGNOSIS — C50411 Malignant neoplasm of upper-outer quadrant of right female breast: Secondary | ICD-10-CM | POA: Diagnosis not present

## 2024-06-29 DIAGNOSIS — Z79899 Other long term (current) drug therapy: Secondary | ICD-10-CM | POA: Diagnosis not present

## 2024-06-29 DIAGNOSIS — Z170 Estrogen receptor positive status [ER+]: Secondary | ICD-10-CM | POA: Diagnosis not present

## 2024-07-07 DIAGNOSIS — C50919 Malignant neoplasm of unspecified site of unspecified female breast: Secondary | ICD-10-CM | POA: Diagnosis not present

## 2024-07-07 DIAGNOSIS — Z1331 Encounter for screening for depression: Secondary | ICD-10-CM | POA: Diagnosis not present

## 2024-07-07 DIAGNOSIS — H5213 Myopia, bilateral: Secondary | ICD-10-CM | POA: Diagnosis not present

## 2024-07-07 DIAGNOSIS — H52203 Unspecified astigmatism, bilateral: Secondary | ICD-10-CM | POA: Diagnosis not present

## 2024-07-07 DIAGNOSIS — Z0000 Encounter for general adult medical examination without abnormal findings: Secondary | ICD-10-CM | POA: Diagnosis not present

## 2024-07-07 DIAGNOSIS — Z23 Encounter for immunization: Secondary | ICD-10-CM | POA: Diagnosis not present

## 2024-07-07 DIAGNOSIS — R82998 Other abnormal findings in urine: Secondary | ICD-10-CM | POA: Diagnosis not present

## 2024-07-27 DIAGNOSIS — C50411 Malignant neoplasm of upper-outer quadrant of right female breast: Secondary | ICD-10-CM | POA: Diagnosis not present

## 2024-07-27 DIAGNOSIS — Z5111 Encounter for antineoplastic chemotherapy: Secondary | ICD-10-CM | POA: Diagnosis not present

## 2024-07-27 DIAGNOSIS — Z79818 Long term (current) use of other agents affecting estrogen receptors and estrogen levels: Secondary | ICD-10-CM | POA: Diagnosis not present

## 2024-07-27 DIAGNOSIS — Z170 Estrogen receptor positive status [ER+]: Secondary | ICD-10-CM | POA: Diagnosis not present

## 2024-08-09 DIAGNOSIS — Z9011 Acquired absence of right breast and nipple: Secondary | ICD-10-CM | POA: Diagnosis not present

## 2024-08-09 DIAGNOSIS — Z853 Personal history of malignant neoplasm of breast: Secondary | ICD-10-CM | POA: Diagnosis not present

## 2024-08-09 DIAGNOSIS — Z170 Estrogen receptor positive status [ER+]: Secondary | ICD-10-CM | POA: Diagnosis not present

## 2024-08-09 DIAGNOSIS — Z452 Encounter for adjustment and management of vascular access device: Secondary | ICD-10-CM | POA: Diagnosis not present

## 2024-08-09 DIAGNOSIS — Z08 Encounter for follow-up examination after completed treatment for malignant neoplasm: Secondary | ICD-10-CM | POA: Diagnosis not present

## 2024-08-09 DIAGNOSIS — Z9221 Personal history of antineoplastic chemotherapy: Secondary | ICD-10-CM | POA: Diagnosis not present

## 2024-08-09 DIAGNOSIS — Z923 Personal history of irradiation: Secondary | ICD-10-CM | POA: Diagnosis not present

## 2024-08-09 DIAGNOSIS — C50411 Malignant neoplasm of upper-outer quadrant of right female breast: Secondary | ICD-10-CM | POA: Diagnosis not present

## 2024-08-24 DIAGNOSIS — Z79818 Long term (current) use of other agents affecting estrogen receptors and estrogen levels: Secondary | ICD-10-CM | POA: Diagnosis not present

## 2024-08-24 DIAGNOSIS — Z5111 Encounter for antineoplastic chemotherapy: Secondary | ICD-10-CM | POA: Diagnosis not present

## 2024-08-24 DIAGNOSIS — C50411 Malignant neoplasm of upper-outer quadrant of right female breast: Secondary | ICD-10-CM | POA: Diagnosis not present

## 2024-08-24 DIAGNOSIS — Z170 Estrogen receptor positive status [ER+]: Secondary | ICD-10-CM | POA: Diagnosis not present

## 2024-09-21 DIAGNOSIS — M2550 Pain in unspecified joint: Secondary | ICD-10-CM | POA: Diagnosis not present

## 2024-09-21 DIAGNOSIS — C50411 Malignant neoplasm of upper-outer quadrant of right female breast: Secondary | ICD-10-CM | POA: Diagnosis not present

## 2024-09-21 DIAGNOSIS — Z5181 Encounter for therapeutic drug level monitoring: Secondary | ICD-10-CM | POA: Diagnosis not present

## 2024-09-21 DIAGNOSIS — E2839 Other primary ovarian failure: Secondary | ICD-10-CM | POA: Diagnosis not present
